# Patient Record
Sex: Female | Born: 1960 | Race: Black or African American | Hispanic: No | Marital: Married | Smoking: Never smoker
Health system: Southern US, Community
[De-identification: ages and names within clinical notes are randomized; demographics above are authoritative.]

## PROBLEM LIST (undated history)

## (undated) DIAGNOSIS — Q141 Congenital malformation of retina: Secondary | ICD-10-CM

## (undated) DIAGNOSIS — D219 Benign neoplasm of connective and other soft tissue, unspecified: Secondary | ICD-10-CM

## (undated) DIAGNOSIS — H409 Unspecified glaucoma: Secondary | ICD-10-CM

## (undated) DIAGNOSIS — E669 Obesity, unspecified: Secondary | ICD-10-CM

## (undated) DIAGNOSIS — E559 Vitamin D deficiency, unspecified: Secondary | ICD-10-CM

## (undated) HISTORY — DX: Unspecified glaucoma: H40.9

## (undated) HISTORY — DX: Obesity, unspecified: E66.9

## (undated) HISTORY — DX: Vitamin D deficiency, unspecified: E55.9

---
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
## 2002-12-15 ENCOUNTER — Encounter: Payer: Self-pay | Admitting: Internal Medicine

## 2002-12-15 ENCOUNTER — Encounter: Admission: RE | Admit: 2002-12-15 | Discharge: 2002-12-15 | Payer: Self-pay | Admitting: Internal Medicine

## 2002-12-15 ENCOUNTER — Encounter (INDEPENDENT_AMBULATORY_CARE_PROVIDER_SITE_OTHER): Payer: Self-pay | Admitting: Specialist

## 2003-12-27 ENCOUNTER — Encounter: Admission: RE | Admit: 2003-12-27 | Discharge: 2003-12-27 | Payer: Self-pay | Admitting: Internal Medicine

## 2009-12-06 ENCOUNTER — Emergency Department (HOSPITAL_COMMUNITY): Admission: EM | Admit: 2009-12-06 | Discharge: 2009-12-06 | Payer: Self-pay | Admitting: Family Medicine

## 2010-09-16 LAB — URINE CULTURE
Colony Count: NO GROWTH
Culture: NO GROWTH

## 2010-09-16 LAB — POCT URINALYSIS DIP (DEVICE)
Bilirubin Urine: NEGATIVE
Glucose, UA: NEGATIVE mg/dL
Ketones, ur: NEGATIVE mg/dL
Nitrite: NEGATIVE
Protein, ur: NEGATIVE mg/dL
Specific Gravity, Urine: 1.025 (ref 1.005–1.030)
Urobilinogen, UA: 0.2 mg/dL (ref 0.0–1.0)
pH: 5 (ref 5.0–8.0)

## 2011-07-31 ENCOUNTER — Encounter (HOSPITAL_COMMUNITY): Payer: Self-pay | Admitting: *Deleted

## 2011-07-31 ENCOUNTER — Emergency Department (HOSPITAL_COMMUNITY)
Admission: EM | Admit: 2011-07-31 | Discharge: 2011-07-31 | Disposition: A | Discharge status: Home / Self Care | Payer: 59 | Source: Home / Self Care | Attending: Emergency Medicine | Admitting: Emergency Medicine

## 2011-07-31 DIAGNOSIS — H6980 Other specified disorders of Eustachian tube, unspecified ear: Secondary | ICD-10-CM

## 2011-07-31 HISTORY — DX: Benign neoplasm of connective and other soft tissue, unspecified: D21.9

## 2011-07-31 MED ORDER — FEXOFENADINE-PSEUDOEPHED ER 60-120 MG PO TB12: 1.0000 | ORAL_TABLET | Freq: Two times a day (BID) | ORAL | Status: AC

## 2011-07-31 MED ORDER — ANTIPYRINE-BENZOCAINE 5.4-1.4 % OT SOLN: 3.0000 [drp] | Freq: Four times a day (QID) | OTIC | Status: AC | PRN

## 2011-07-31 MED ORDER — FLUTICASONE PROPIONATE 50 MCG/ACT NA SUSP: 2.0000 | Freq: Every day | NASAL | Status: DC

## 2011-07-31 NOTE — ED Provider Notes (Signed)
History     CSN: [id number]  Arrival date & time 07/31/11  1134   First MD Initiated Contact with Patient 07/31/11 1359      Chief Complaint  Patient presents with  . Otalgia    (Consider location/radiation/quality/duration/timing/severity/associated sxs/prior treatment) HPI Comments: Patient with left ear fullness, sensation of fluid behind her ear x3 weeks. Reports mild rhinorrhea, but no nasal discharge, frontal sinus pain/pressure. Reports occasional disequilibrium, but no vertigo, nausea, vomiting. No headaches. No change in hearing, ear pain, otorrhea. States her ear canal occasionally itches, and she had a physician look at it. Was noted to be swollen, but had no signs of infection. Patient denies any foreign body insertion into the ear. Patient states that her ear has been "popping" starting yesterday, and her symptoms are slightly better. Patient does have history of seasonal allergies, and takes Benadryl at night for the eye itching.  ROS as noted in HPI. All other ROS negative.   Patient is a 51 y.o. female presenting with plugged ear sensation.  Ear Fullness This is a new problem. The current episode started more than 1 week ago. The problem occurs constantly. The problem has not changed since onset.The symptoms are aggravated by nothing. The symptoms are relieved by nothing. She has tried nothing for the symptoms. The treatment provided no relief.    Past Medical History  Diagnosis Date  . Fibroids     Past Surgical History  Procedure Date  . Cesarean section     History reviewed. No pertinent family history.  History  Substance Use Topics  . Smoking status: Former Games developer  . Smokeless tobacco: Not on file  . Alcohol Use: No    OB History    Grav Para Term Preterm Abortions TAB SAB Ect Mult Living                  Review of Systems  Allergies  Review of patient's allergies indicates no known allergies.  Home Medications   Current Outpatient Rx    Name Route Sig Dispense Refill  . ANTIPYRINE-BENZOCAINE 5.4-1.4 % OT SOLN Left Ear Place 3 drops into the left ear 4 (four) times daily as needed for pain. 10 mL 0  . FEXOFENADINE-PSEUDOEPHED ER 60-120 MG PO TB12 Oral Take 1 tablet by mouth every 12 (twelve) hours. 14 tablet 0  . FLUTICASONE PROPIONATE 50 MCG/ACT NA SUSP Nasal Place 2 sprays into the nose daily. 16 g 0    BP 137/75  Pulse 82  Temp(Src) 98.4 F (36.9 C) (Oral)  Resp 16  SpO2 100%  LMP 07/31/2011  Physical Exam  Nursing note and vitals reviewed. Constitutional: She is oriented to person, place, and time. She appears well-developed and well-nourished.  HENT:  Head: Normocephalic and atraumatic.  Right Ear: Hearing, tympanic membrane, external ear and ear canal normal.  Left Ear: Hearing, external ear and ear canal normal. No drainage or swelling. Tympanic membrane is retracted. Tympanic membrane is not injected and not perforated.  No middle ear effusion.  Nose: Mucosal edema and rhinorrhea present. No epistaxis.  Mouth/Throat: Uvula is midline, oropharynx is clear and moist and mucous membranes are normal. No oropharyngeal exudate.       (-) frontal, maxillary sinus tenderness  Eyes: Conjunctivae and EOM are normal. Pupils are equal, round, and reactive to light.  Neck: Normal range of motion. Neck supple.  Cardiovascular: Normal rate, regular rhythm and normal heart sounds.   Pulmonary/Chest: Effort normal and breath sounds normal. No respiratory distress.  She has no wheezes. She has no rales.  Abdominal: She exhibits no distension. There is no tenderness. There is no rebound and no guarding.  Musculoskeletal: Normal range of motion.  Lymphadenopathy:    She has no cervical adenopathy.  Neurological: She is alert and oriented to person, place, and time. She has normal strength. No cranial nerve deficit or sensory deficit.       Gait nml  Skin: Skin is warm and dry. No rash noted.  Psychiatric: She has a normal  mood and affect. Her behavior is normal. Judgment and thought content normal.    ED Course  Procedures (including critical care time)  Labs Reviewed - No data to display No results found.   1. Eustachian tube dysfunction       MDM  This is eustachian tube dysfunction, most likely from rhinorrhea/seasonal allergies. No evidence of otitis externa, otitis media. No evidence of serous otitis. Patient does complain of ear canal itching, so we'll send home with Auralgan. Will also start her on antihistamine/decongestant, and nasal steroids.  Luiz Blare, MD 07/31/11 [phone]

## 2011-07-31 NOTE — ED Notes (Signed)
Pt  Has  Symptoms  Of  Pressure  And  Fullness  In l  Ear  With  Some   dizzyness  As  Well      For  Several  Weeks  She  Describes  sesation  Of  Popping  As  Well

## 2013-03-19 ENCOUNTER — Emergency Department (HOSPITAL_COMMUNITY)
Admission: EM | Admit: 2013-03-19 | Discharge: 2013-03-19 | Disposition: A | Discharge status: Home / Self Care | Payer: 59 | Attending: Emergency Medicine | Admitting: Emergency Medicine

## 2013-03-19 ENCOUNTER — Encounter (HOSPITAL_COMMUNITY): Payer: Self-pay | Admitting: Emergency Medicine

## 2013-03-19 DIAGNOSIS — M549 Dorsalgia, unspecified: Secondary | ICD-10-CM | POA: Insufficient documentation

## 2013-03-19 DIAGNOSIS — Z87891 Personal history of nicotine dependence: Secondary | ICD-10-CM | POA: Insufficient documentation

## 2013-03-19 DIAGNOSIS — Z8742 Personal history of other diseases of the female genital tract: Secondary | ICD-10-CM | POA: Insufficient documentation

## 2013-03-19 DIAGNOSIS — B029 Zoster without complications: Secondary | ICD-10-CM | POA: Insufficient documentation

## 2013-03-19 DIAGNOSIS — IMO0001 Reserved for inherently not codable concepts without codable children: Secondary | ICD-10-CM | POA: Insufficient documentation

## 2013-03-19 DIAGNOSIS — M542 Cervicalgia: Secondary | ICD-10-CM | POA: Insufficient documentation

## 2013-03-19 DIAGNOSIS — R21 Rash and other nonspecific skin eruption: Secondary | ICD-10-CM | POA: Insufficient documentation

## 2013-03-19 MED ORDER — NAPROXEN 500 MG PO TABS: 500.0000 mg | ORAL_TABLET | Freq: Two times a day (BID) | ORAL | Status: DC

## 2013-03-19 MED ORDER — HYDROCODONE-ACETAMINOPHEN 5-325 MG PO TABS
1.0000 | ORAL_TABLET | Freq: Once | ORAL | Status: AC
  Administered 2013-03-19: 1 via ORAL
  Filled 2013-03-19: qty 1

## 2013-03-19 MED ORDER — HYDROCODONE-ACETAMINOPHEN 5-325 MG PO TABS: 1.0000 | ORAL_TABLET | ORAL | Status: DC | PRN

## 2013-03-19 MED ORDER — VALACYCLOVIR HCL 1 G PO TABS: 1000.0000 mg | ORAL_TABLET | Freq: Three times a day (TID) | ORAL | Status: DC

## 2013-03-19 NOTE — ED Provider Notes (Addendum)
CSN: [id number]     Arrival date & time 03/19/13  7829 History   First MD Initiated Contact with Patient 03/19/13 0701     Chief Complaint  Patient presents with  . Arm Pain    HPI  Pt c/o pain from shoulder to hand on LUE.  Started tue or wed (2-4 days ago). Started with pain, described as burning.  No CP, SOB.  No fever or prostration. Uncertain if she ever had varicella, but states that she was exposed several years ago.  Had serum titers drawn, Results?  No weakness or clumbsiness with use of UE.  No LE symptoms.  No bowel/bladder changes.  Past Medical History  Diagnosis Date  . Fibroids    Past Surgical History  Procedure Laterality Date  . Cesarean section     No family history on file. History  Substance Use Topics  . Smoking status: Former Games developer  . Smokeless tobacco: Not on file  . Alcohol Use: No   OB History   Grav Para Term Preterm Abortions TAB SAB Ect Mult Living                 Review of Systems  Constitutional: Negative for fever, chills, diaphoresis, appetite change and fatigue.  HENT: Positive for neck pain. Negative for sore throat, mouth sores and trouble swallowing.   Eyes: Negative for visual disturbance.  Respiratory: Negative for cough, chest tightness, shortness of breath and wheezing.   Cardiovascular: Negative for chest pain.  Gastrointestinal: Negative for nausea, vomiting, abdominal pain, diarrhea and abdominal distention.  Endocrine: Negative for polydipsia, polyphagia and polyuria.  Genitourinary: Negative for dysuria, frequency and hematuria.  Musculoskeletal: Positive for myalgias, back pain and arthralgias. Negative for gait problem.  Skin: Positive for rash. Negative for color change and pallor.  Neurological: Negative for dizziness, syncope, light-headedness and headaches.  Hematological: Does not bruise/bleed easily.  Psychiatric/Behavioral: Negative for behavioral problems and confusion.    Allergies  Codeine  Home Medications     Current Outpatient Rx  Name  Route  Sig  Dispense  Refill  . PRESCRIPTION MEDICATION   Both Eyes   Place 1 drop into both eyes daily. For Glaucoma         . HYDROcodone-acetaminophen (NORCO/VICODIN) 5-325 MG per tablet   Oral   Take 1 tablet by mouth every 4 (four) hours as needed for pain.   10 tablet   0   . naproxen (NAPROSYN) 500 MG tablet   Oral   Take 1 tablet (500 mg total) by mouth 2 (two) times daily.   30 tablet   0   . valACYclovir (VALTREX) 1000 MG tablet   Oral   Take 1 tablet (1,000 mg total) by mouth 3 (three) times daily.   21 tablet   0    BP 154/99  Temp(Src) 98.2 F (36.8 C) (Oral)  Resp 12  SpO2 98% Physical Exam  Constitutional: She is oriented to person, place, and time. She appears well-developed and well-nourished. No distress.  HENT:  Head: Normocephalic.  Eyes: Conjunctivae are normal. Pupils are equal, round, and reactive to light. No scleral icterus.  Neck: Normal range of motion. Neck supple. No thyromegaly present.  Cardiovascular: Normal rate and regular rhythm.  Exam reveals no gallop and no friction rub.   No murmur heard. Pulmonary/Chest: Effort normal and breath sounds normal. No respiratory distress. She has no wheezes. She has no rales.  Abdominal: Soft. Bowel sounds are normal. She exhibits no distension. There  is no tenderness. There is no rebound.  Musculoskeletal: Normal range of motion.  Neurological: She is alert and oriented to person, place, and time.  Normal symmetric Strength to shoulder shrug, triceps, biceps, grip,wrist flex/extend,and intrinsics  Norma lsymmetric sensation above and below clavicles, and to all distributions to UEs. Norma symmetric strength to flex/.extend hip and knees, dorsi/plantar flex ankles. Normal symmetric sensation to all distributions to LEs Patellar and achilles reflexes 1-2+. Downgoing Babinski  Pain does follow C7 path.  Equivocal l Spurling's sign.  No Lhermitte's phenomenon.    Skin: Skin is warm and dry. No rash noted.     Psychiatric: She has a normal mood and affect. Her behavior is normal.    ED Course  Procedures (including critical care time)  EKG: Indication extremity pain back pain. Interpretation sinus rhythm no acute or ischemic changes. No ST changes. Normal intervals.  Labs Review Labs Reviewed - No data to display Imaging Review No results found.  MDM   1. Shingles    Pt with c7 radicular pain.  NOw with papules/bullae overlying C7 dermatome at elbow and palm consistent with shingles.  Plan:  Valtrex, motrin, vicoden.  F/u Dr. Ricki Miller.  We discussed radiculopathy as well.  If rash resolves and pain does not, then consideration for MRI with dx of Post Herpetic Neuralgia vs C7 radiculopathy.  No signs or symptoms of Myelopathy per history or exam now.    Roney Marion, MD 03/19/13 1610  Roney Marion, MD 03/19/13 [phone]

## 2013-03-19 NOTE — ED Notes (Signed)
Pt reports having  Left arm pain since Tuesday. Pt thought it was tendonitis, pt reports she has been taking ibuprofen since Tuesday, and has had no relief. Pt states that now the pain is "moving." Pt states she has pain in her neck, and sometimes that it radiates to her back from her arm. Denies loss of sensation in extremities. Denies n/v, sob.

## 2013-06-07 ENCOUNTER — Other Ambulatory Visit (HOSPITAL_COMMUNITY): Payer: Self-pay | Admitting: Obstetrics and Gynecology

## 2013-06-07 DIAGNOSIS — Z1231 Encounter for screening mammogram for malignant neoplasm of breast: Secondary | ICD-10-CM

## 2013-06-08 ENCOUNTER — Ambulatory Visit (AMBULATORY_SURGERY_CENTER): Payer: Self-pay | Admitting: *Deleted

## 2013-06-08 VITALS — Ht 59.0 in | Wt 215.0 lb

## 2013-06-08 DIAGNOSIS — Z1211 Encounter for screening for malignant neoplasm of colon: Secondary | ICD-10-CM

## 2013-06-08 MED ORDER — MOVIPREP 100 G PO SOLR: ORAL | Status: DC

## 2013-06-08 NOTE — Progress Notes (Signed)
No allergies to eggs or soy. No problems with anesthesia.  

## 2013-06-15 ENCOUNTER — Encounter: Payer: Self-pay | Admitting: Gastroenterology

## 2013-06-15 ENCOUNTER — Ambulatory Visit (AMBULATORY_SURGERY_CENTER): Payer: 59 | Admitting: Gastroenterology

## 2013-06-15 VITALS — BP 151/81 | HR 79 | Temp 97.4°F | Resp 10 | Ht 59.0 in | Wt 215.0 lb

## 2013-06-15 DIAGNOSIS — D126 Benign neoplasm of colon, unspecified: Secondary | ICD-10-CM

## 2013-06-15 DIAGNOSIS — D128 Benign neoplasm of rectum: Secondary | ICD-10-CM

## 2013-06-15 DIAGNOSIS — Z1211 Encounter for screening for malignant neoplasm of colon: Secondary | ICD-10-CM

## 2013-06-15 MED ORDER — SODIUM CHLORIDE 0.9 % IV SOLN: 500.0000 mL | INTRAVENOUS | Status: DC

## 2013-06-15 NOTE — Op Note (Signed)
[facility] [address] [place], [address]   COLONOSCOPY PROCEDURE REPORT  PATIENT: Tammy Tucker, Tammy Tucker  MR#: [id number] BIRTHDATE: March 11, 1961 , 52  yrs. old GENDER: Female ENDOSCOPIST: Rachael Fee, MD REFERRED [id number] Cherly Hensen, M.D. PROCEDURE DATE:  06/15/2013 PROCEDURE:   Colonoscopy with biopsy and snare polypectomy First Screening Colonoscopy - Avg.  risk and is 50 yrs.  old or older Yes.  Prior Negative Screening - Now for repeat screening. N/A  History of Adenoma - Now for follow-up colonoscopy & has been > or = to 3 yrs.  N/A  Polyps Removed Today? Yes. ASA CLASS:   Class II INDICATIONS:average risk screening. MEDICATIONS: Fentanyl 75 mcg IV, Versed 7 mg IV, and These medications were titrated to patient response per physician's verbal order  DESCRIPTION OF PROCEDURE:   After the risks benefits and alternatives of the procedure were thoroughly explained, informed consent was obtained.  A digital rectal exam revealed no abnormalities of the rectum.   The LB [id number]  endoscope was introduced through the anus and advanced to the cecum, which was identified by both the appendix and ileocecal valve. No adverse events experienced.   The quality of the prep was good.  The instrument was then slowly withdrawn as the colon was fully examined.  COLON FINDINGS: Three polyps were found, removed and two of them were retrieved to be sent to pathology.  One was sessile, located in transverse segment 2mm across, removed with biopsy forceps, retrieved.  The other two were sessil, 3-34mm across, located in transverse and rectum segments, both removed with cold snare, one was retrieved and sent to pathology.  The examination was otherwise normal.  Retroflexed views revealed no abnormalities. The time to cecum=2 minutes 32 seconds.  Withdrawal time=9 minutes 11 seconds. The scope was withdrawn and the procedure completed. COMPLICATIONS: There were no  complications.  ENDOSCOPIC IMPRESSION: Three polyps were found, removed and two of them were retrieved to be sent to pathology. The examination was otherwise normal.  RECOMMENDATIONS: If the polyp(s) removed today are proven to be adenomatous (pre-cancerous) polyps, you will need a repeat colonoscopy in 5 years.  Otherwise you should continue to follow colorectal cancer screening guidelines for "routine risk" patients with colonoscopy in 10 years.  You will receive a letter within 1-2 weeks with the results of your biopsy as well as final recommendations.  Please call my office if you have not received a letter after 3 weeks.   eSigned:  Rachael Fee, MD 06/15/2013 10:42 AM

## 2013-06-15 NOTE — Patient Instructions (Signed)
Impressions/recommendations:  Polyps (handout given)  Repeat colonoscopy pending pathology results.  YOU HAD AN ENDOSCOPIC PROCEDURE TODAY AT THE [facility]: Refer to the procedure report that was given to you for any specific questions about what was found during the examination.  If the procedure report does not answer your questions, please call your gastroenterologist to clarify.  If you requested that your care partner not be given the details of your procedure findings, then the procedure report has been included in a sealed envelope for you to review at your convenience later.  YOU SHOULD EXPECT: Some feelings of bloating in the abdomen. Passage of more gas than usual.  Walking can help get rid of the air that was put into your GI tract during the procedure and reduce the bloating. If you had a lower endoscopy (such as a colonoscopy or flexible sigmoidoscopy) you may notice spotting of blood in your stool or on the toilet paper. If you underwent a bowel prep for your procedure, then you may not have a normal bowel movement for a few days.  DIET: Your first meal following the procedure should be a light meal and then it is ok to progress to your normal diet.  A half-sandwich or bowl of soup is an example of a good first meal.  Heavy or fried foods are harder to digest and may make you feel nauseous or bloated.  Likewise meals heavy in dairy and vegetables can cause extra gas to form and this can also increase the bloating.  Drink plenty of fluids but you should avoid alcoholic beverages for 24 hours.  ACTIVITY: Your care partner should take you home directly after the procedure.  You should plan to take it easy, moving slowly for the rest of the day.  You can resume normal activity the day after the procedure however you should NOT DRIVE or use heavy machinery for 24 hours (because of the sedation medicines used during the test).    SYMPTOMS TO REPORT IMMEDIATELY: A  gastroenterologist can be reached at any hour.  During normal business hours, 8:30 AM to 5:00 PM Monday through Friday, call [phone].  After hours and on weekends, please call the GI answering service at [phone] who will take a message and have the physician on call contact you.   Following lower endoscopy (colonoscopy or flexible sigmoidoscopy):  Excessive amounts of blood in the stool  Significant tenderness or worsening of abdominal pains  Swelling of the abdomen that is new, acute  Fever of 100F or higher   FOLLOW UP: If any biopsies were taken you will be contacted by phone or by letter within the next 1-3 weeks.  Call your gastroenterologist if you have not heard about the biopsies in 3 weeks.  Our staff will call the home number listed on your records the next business day following your procedure to check on you and address any questions or concerns that you may have at that time regarding the information given to you following your procedure. This is a courtesy call and so if there is no answer at the home number and we have not heard from you through the emergency physician on call, we will assume that you have returned to your regular daily activities without incident.  SIGNATURES/CONFIDENTIALITY: You and/or your care partner have signed paperwork which will be entered into your electronic medical record.  These signatures attest to the fact that that the information above on your After Visit Summary has   been reviewed and is understood.  Full responsibility of the confidentiality of this discharge information lies with you and/or your care-partner. 

## 2013-06-15 NOTE — Progress Notes (Signed)
Pt had a 1st IV attempt in right wrist, states it is very painful. No swelling noted. Hot pack applied to arm which patient states feels good.

## 2013-06-16 ENCOUNTER — Telehealth: Payer: Self-pay | Admitting: *Deleted

## 2013-06-16 NOTE — Telephone Encounter (Signed)
  Follow up Call-  Left message to call if questions or concerns.

## 2013-06-22 ENCOUNTER — Encounter: Payer: Self-pay | Admitting: Gastroenterology

## 2013-07-06 ENCOUNTER — Ambulatory Visit (HOSPITAL_COMMUNITY)
Admission: RE | Admit: 2013-07-06 | Discharge: 2013-07-06 | Disposition: A | Discharge status: Home / Self Care | Payer: 59 | Source: Ambulatory Visit | Attending: Obstetrics and Gynecology | Admitting: Obstetrics and Gynecology

## 2013-07-06 DIAGNOSIS — Z1231 Encounter for screening mammogram for malignant neoplasm of breast: Secondary | ICD-10-CM | POA: Insufficient documentation

## 2013-08-06 ENCOUNTER — Encounter: Payer: 59 | Attending: Obstetrics and Gynecology

## 2013-09-03 ENCOUNTER — Encounter: Payer: 59 | Attending: Obstetrics and Gynecology

## 2013-09-03 VITALS — Ht 59.0 in | Wt 210.0 lb

## 2013-09-03 DIAGNOSIS — Z713 Dietary counseling and surveillance: Secondary | ICD-10-CM | POA: Insufficient documentation

## 2013-09-03 DIAGNOSIS — E119 Type 2 diabetes mellitus without complications: Secondary | ICD-10-CM | POA: Insufficient documentation

## 2013-09-27 ENCOUNTER — Ambulatory Visit: Payer: 59 | Admitting: Dietician

## 2013-10-03 NOTE — Progress Notes (Signed)
Patient was seen on 09/03/13 for the complete diabetes self-management series at the [facility].  Current A1c =  Patient states: 4 on 06/18/13  Handouts given during class include:  Living Well with Diabetes book  Carb Counting and Meal Planning book  Meal Plan Card  Carbohydrate guide  Meal planning worksheet  Low Sodium Flavoring Tips  The diabetes portion plate  Low Carbohydrate Snack Suggestions  A1c to eAG Conversion Chart  Diabetes Medications  Stress Management  Diabetes Recommended Care Schedule  Diabetes Success Plan  Core Class Satisfaction Survey  The following learning objectives were met by the patient during this course:  Describe diabetes  State some common risk factors for diabetes  Defines the role of glucose and insulin  Identifies type of diabetes and pathophysiology  Describe the relationship between diabetes and cardiovascular risk  State the members of the Healthcare Team  States the rationale for glucose monitoring  State when to test glucose  State their individual Target Range  State the importance of logging glucose readings  Describe how to interpret glucose readings  Identifies A1C target  Explain the correlation between A1c and eAG values  State symptoms and treatment of high blood glucose  State symptoms and treatment of low blood glucose  Explain proper technique for glucose testing  Identifies proper sharps disposal  Describe the role of different macronutrients on glucose  Explain how carbohydrates affect blood glucose  State what foods contain the most carbohydrates  Demonstrate carbohydrate counting  Demonstrate how to read Nutrition Facts food label  Describe effects of various fats on heart health  Describe the importance of good nutrition for health and healthy eating strategies  Describe techniques for managing your shopping, cooking and meal planning  List  strategies to follow meal plan when dining out  Describe the effects of alcohol on glucose and how to use it safely   State the amount of activity recommended for healthy living   Describe activities suitable for individual needs   Identify ways to regularly incorporate activity into daily life   Identify barriers to activity and ways to over come these barriers  Identify diabetes medications being personally used and their primary action for lowering glucose and possible side effects   Describe role of stress on blood glucose and develop strategies to address psychosocial issues   Identify diabetes complications and ways to prevent them  Explain how to manage diabetes during illness   Evaluate success in meeting personal goal   Establish 2-3 goals that they will plan to diligently work on until they return for the  59-month follow-up visit  Goals:  Follow Diabetes Meal Plan as instructed  Eat 3 meals and 2 snacks, every 3-5 hrs  Limit carbohydrate intake to 45 grams carbohydrate/meal Limit carbohydrate intake to 15 grams carbohydrate/snack Add lean protein foods to meals/snacks  Monitor glucose levels as instructed by your doctor  Aim for 15-30 mins of physical activity daily as tolerated  Bring food record and glucose log to your next nutrition visit  Your patient has established the following 4 month goals in their individualized success plan: Goals: I will count my carb choices at most meals and snacks I will increase my activity level at least 3 days a week  Your patient has identified these potential barriers to change:  none  Your patient has identified their diabetes self-care support plan as  none  Plan: Attend Core 4 in 4 months

## 2014-08-26 ENCOUNTER — Emergency Department (HOSPITAL_COMMUNITY)
Admission: EM | Admit: 2014-08-26 | Discharge: 2014-08-26 | Disposition: A | Discharge status: Home / Self Care | Payer: 59 | Source: Home / Self Care | Attending: Emergency Medicine | Admitting: Emergency Medicine

## 2014-08-26 ENCOUNTER — Encounter (HOSPITAL_COMMUNITY): Payer: Self-pay | Admitting: Emergency Medicine

## 2014-08-26 DIAGNOSIS — N200 Calculus of kidney: Secondary | ICD-10-CM

## 2014-08-26 LAB — POCT URINALYSIS DIP (DEVICE)
Bilirubin Urine: NEGATIVE
Glucose, UA: NEGATIVE mg/dL
Ketones, ur: NEGATIVE mg/dL
Leukocytes, UA: NEGATIVE
Nitrite: NEGATIVE
Protein, ur: NEGATIVE mg/dL
Specific Gravity, Urine: >=1.03 (ref 1.005–1.030)
Urobilinogen, UA: >=8 mg/dL (ref 0.0–1.0)
pH: 7 (ref 5.0–8.0)

## 2014-08-26 MED ORDER — IBUPROFEN 800 MG PO TABS: 800.0000 mg | ORAL_TABLET | Freq: Three times a day (TID) | ORAL | Status: DC | PRN

## 2014-08-26 MED ORDER — TAMSULOSIN HCL 0.4 MG PO CAPS: 0.4000 mg | ORAL_CAPSULE | Freq: Every day | ORAL | Status: DC

## 2014-08-26 MED ORDER — HYDROCODONE-ACETAMINOPHEN 5-325 MG PO TABS: 1.0000 | ORAL_TABLET | Freq: Four times a day (QID) | ORAL | Status: DC | PRN

## 2014-08-26 NOTE — ED Notes (Signed)
Reports on Monday bending over to work on a pt and felt a sharp pain in the left flank that radiates to front of abdomen.   Denies any urinary symptoms.  No known injury.  Pt has increased water intake with no relief in symptoms.

## 2014-08-26 NOTE — Discharge Instructions (Signed)
You likely had a kidney stone. Take Flomax daily to help the stone pass. Strain your urine to collect the stone. Take ibuprofen 3 times a day. Use the Norco as needed for severe pain. If your pain is not better in the next 3-4 days, please go to the emergency room for additional evaluation.

## 2014-08-26 NOTE — ED Provider Notes (Signed)
CSN: [id number]     Arrival date & time 08/26/14  1504 History   First MD Initiated Contact with Patient 08/26/14 1618     Chief Complaint  Patient presents with  . Flank Pain   (Consider location/radiation/quality/duration/timing/severity/associated sxs/prior Treatment) HPI She is a 54 year old woman here for evaluation of left flank pain. She states this started on Monday and she bent over to assist the patient. She reports colicky type pain in the left flank. It has been getting worse, particularly the last day or so. The pain is located in the left flank and will radiate around to her abdomen. No fevers or chills. No nausea, vomiting, diarrhea. No constipation. No dysuria. She has not noticed any hematuria. She has been taking ibuprofen with moderate improvement.  Past Medical History  Diagnosis Date  . Fibroids   . Vitamin D deficiency   . Glaucoma   . Hyperlipidemia   . Obesity    Past Surgical History  Procedure Laterality Date  . Cesarean section  1994, 1990  . Breast biopsy  1977    benign   Family History  Problem Relation Age of Onset  . Colon cancer Neg Hx    History  Substance Use Topics  . Smoking status: Former Smoker    Quit date: 06/30/1978  . Smokeless tobacco: Never Used  . Alcohol Use: No   OB History    No data available     Review of Systems  Constitutional: Negative for fever.  Gastrointestinal: Negative for nausea, vomiting, abdominal pain, diarrhea and constipation.  Genitourinary: Positive for flank pain. Negative for dysuria and hematuria.    Allergies  Codeine  Home Medications   Prior to Admission medications   Medication Sig Start Date End Date Taking? Authorizing Provider  ergocalciferol (VITAMIN D2) 50000 UNITS capsule Take 50,000 Units by mouth once a week.    Historical Provider, MD  HYDROcodone-acetaminophen (NORCO) 5-325 MG per tablet Take 1 tablet by mouth every 6 (six) hours as needed for moderate pain. 08/26/14   Melony Overly, MD  ibuprofen (ADVIL,MOTRIN) 800 MG tablet Take 1 tablet (800 mg total) by mouth every 8 (eight) hours as needed for moderate pain. 08/26/14   Melony Overly, MD  PRESCRIPTION MEDICATION Place 1 drop into both eyes daily. For Glaucoma    Historical Provider, MD  tamsulosin (FLOMAX) 0.4 MG CAPS capsule Take 1 capsule (0.4 mg total) by mouth daily. 08/26/14   Melony Overly, MD   BP 140/82 mmHg  Pulse 87  Temp(Src) 98.2 F (36.8 C) (Oral)  Resp 16  SpO2 97%  LMP 06/01/2013 Physical Exam  Constitutional: She is oriented to person, place, and time. She appears well-developed and well-nourished. She appears distressed (looks uncomfortable, shifting frequently).  Cardiovascular: Normal rate.   Pulmonary/Chest: Effort normal.  Abdominal: Soft.  No CVA tenderness  Neurological: She is alert and oriented to person, place, and time.    ED Course  Procedures (including critical care time) Labs Review Labs Reviewed  POCT URINALYSIS DIP (DEVICE) - Abnormal; Notable for the following:    Hgb urine dipstick TRACE (*)    All other components within normal limits    Imaging Review No results found.   MDM   1. Kidney stone    History and UA concerning for kidney stone. We'll treat with Flomax, ibuprofen, Norco. Recommended follow-up in the ER if not improved in the next 3-4 days.    Melony Overly, MD 08/26/14 [id number]

## 2014-10-02 NOTE — ED Notes (Signed)
Call from patient , asking for her paper work to be faxed to [facility], so she can be evaluated by a specialist, as she is not feeling well

## 2015-08-20 MED FILL — LATANOPROST 0.005% EYE DRP: 0.005 | 30 days supply | Qty: 3 | Fill #0

## 2015-10-01 ENCOUNTER — Encounter (INDEPENDENT_AMBULATORY_CARE_PROVIDER_SITE_OTHER): Payer: Self-pay | Admitting: Ophthalmology

## 2015-10-22 ENCOUNTER — Encounter (INDEPENDENT_AMBULATORY_CARE_PROVIDER_SITE_OTHER): Payer: Managed Care, Other (non HMO) | Admitting: Ophthalmology

## 2015-10-22 DIAGNOSIS — H43813 Vitreous degeneration, bilateral: Secondary | ICD-10-CM | POA: Diagnosis not present

## 2015-10-22 DIAGNOSIS — H2513 Age-related nuclear cataract, bilateral: Secondary | ICD-10-CM | POA: Diagnosis not present

## 2015-10-22 DIAGNOSIS — H3552 Pigmentary retinal dystrophy: Secondary | ICD-10-CM

## 2015-10-23 MED FILL — LATANOPROST 0.005% EYE DRP: 0.005 | 30 days supply | Qty: 3 | Fill #0

## 2016-02-29 MED FILL — LATANOPROST 0.005% EYE DRP: 0.005 | 30 days supply | Qty: 3 | Fill #1

## 2016-03-11 ENCOUNTER — Other Ambulatory Visit: Payer: Self-pay | Admitting: Obstetrics and Gynecology

## 2016-03-11 DIAGNOSIS — Z1231 Encounter for screening mammogram for malignant neoplasm of breast: Secondary | ICD-10-CM

## 2016-03-31 ENCOUNTER — Ambulatory Visit: Payer: 59

## 2017-04-02 ENCOUNTER — Encounter: Payer: Self-pay | Admitting: Internal Medicine

## 2017-04-02 ENCOUNTER — Telehealth: Payer: Self-pay | Admitting: Dietician

## 2017-04-02 ENCOUNTER — Ambulatory Visit (INDEPENDENT_AMBULATORY_CARE_PROVIDER_SITE_OTHER): Payer: BLUE CROSS/BLUE SHIELD | Admitting: Internal Medicine

## 2017-04-02 VITALS — BP 134/66 | HR 87 | Temp 97.7°F | Ht 59.0 in | Wt 196.7 lb

## 2017-04-02 DIAGNOSIS — Z0000 Encounter for general adult medical examination without abnormal findings: Secondary | ICD-10-CM | POA: Diagnosis not present

## 2017-04-02 DIAGNOSIS — M722 Plantar fascial fibromatosis: Secondary | ICD-10-CM | POA: Diagnosis not present

## 2017-04-02 MED ORDER — IBUPROFEN 800 MG PO TABS: 800.0000 mg | ORAL_TABLET | Freq: Three times a day (TID) | ORAL | 0 refills | Status: DC | PRN

## 2017-04-02 NOTE — Telephone Encounter (Signed)
Left message encouraging her to take advantage of her insurers 100% coverage for medical nutrition therapy and offered assistance with referral or services as needed.

## 2017-04-02 NOTE — Progress Notes (Signed)
CC: Right heel pain  HPI:  Tammy Tucker is a 56 y.o. female with PMH as listed below who presents for evaluation of right heel pain and establishing care.  Plantar Fasciitis Right Foot: Patient with 3-4 days right heel and posterior plantar foot pain described as a dull ache. Pain is worst first thing in the morning when she wakes up and improves during the day with activity. She has no associated numbness or tingling of the foot. She denies any obvious injury. She says she recently wore flat shoes. She is on her feet a lot for work.   Healthcare Maintenance: Patient reports being up to date with her pap smear and mammogram. She follows with her gynecologist, Dr. Servando Salina, for both of these. She had a colonoscopy 06/22/2013 and was recommended to have a repeat in 10 years (2024). She has not had the flu shot and will plan to receive it via work. She has deferred routine lab work for her next visit.  Social Hx: Has 3 children. Lives in [place], moved back here from [place] in March 2018. 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 Former smoker, no alcohol or illicit drug use.  Surgical Hx: Caesarian section x 2. Breast cyst removal as a teenager.  Family Hx: Mother with Diabetes. Sister with Diabetes.  Past Medical History:  Diagnosis Date  . Fibroids   . Glaucoma   . Obesity   . Vitamin D deficiency    Review of Systems:   Review of Systems  Constitutional: Negative for chills, fever and weight loss.  Respiratory: Negative for shortness of breath.   Cardiovascular: Negative for chest pain.  Gastrointestinal: Negative for abdominal pain, constipation, diarrhea, heartburn, nausea and vomiting.  Musculoskeletal: Negative for falls.       Right heel pain  Neurological: Negative for dizziness and loss of consciousness.     Physical Exam:  Vitals:   04/02/17 0836  BP: 134/66  Pulse: 87  Temp: 97.7 F (36.5 C)  TempSrc: Oral  SpO2: 98%  Weight: 196 lb 11.2 oz  (89.2 kg)  Height: 4\' 11"  (1.499 m)   Physical Exam  Constitutional: She is oriented to person, place, and time. She appears well-developed and well-nourished. No distress.  HENT:  Head: Normocephalic and atraumatic.  Cardiovascular: Normal rate and regular rhythm.   No murmur heard. Pulses:      Dorsalis pedis pulses are 2+ on the right side, and 2+ on the left side.       Posterior tibial pulses are 2+ on the right side, and 2+ on the left side.  Pulmonary/Chest: Effort normal. No respiratory distress. She has no wheezes. She has no rales.  Musculoskeletal: She exhibits no edema.  Mild tenderness to palpation of right posteromedial plantar surface. ROM intact. No swelling, erythema, or obvious injury.  Neurological: She is alert and oriented to person, place, and time.  Skin: Skin is warm. She is not diaphoretic.  Psychiatric: She has a normal mood and affect.    Assessment & Plan:   See Encounters Tab for problem based charting.  Patient discussed with Dr. Dareen Piano  Plantar fasciitis, right Symptoms most consistent with plantar fasciitis. Advised to try stretching exercises and can use Ibuprofen as needed for pain and inflammation. She will try shoe inserts and will work on losing weight as well. - Stretching exercises - Ibuprofen 800 mg q8h as needed - follow up in 4 weeks if no improvement  Healthcare maintenance Patient prefers to follow  up with her Gynecologist for pap smear and mammograms. She declined flu shot today, will obtain through work. She declined routine blood work today but is interested in obtaining an A1c in the future. She denies any prior history of diabetes. She is interested in weight loss and advised on healthy eating patterns and given a pamphlet for Health and Wellness.

## 2017-04-02 NOTE — Progress Notes (Signed)
Internal Medicine Clinic Attending  Case discussed with Dr. Patel at the time of the visit.  We reviewed the resident's history and exam and pertinent patient test results.  I agree with the assessment, diagnosis, and plan of care documented in the resident's note.  

## 2017-04-02 NOTE — Assessment & Plan Note (Signed)
Patient prefers to follow up with her Gynecologist for pap smear and mammograms. She declined flu shot today, will obtain through work. She declined routine blood work today but is interested in obtaining an A1c in the future. She denies any prior history of diabetes. She is interested in weight loss and advised on healthy eating patterns and given a pamphlet for Health and Wellness.

## 2017-04-02 NOTE — Assessment & Plan Note (Signed)
Symptoms most consistent with plantar fasciitis. Advised to try stretching exercises and can use Ibuprofen as needed for pain and inflammation. She will try shoe inserts and will work on losing weight as well. - Stretching exercises - Ibuprofen 800 mg q8h as needed - follow up in 4 weeks if no improvement

## 2017-04-02 NOTE — Patient Instructions (Addendum)
It was a pleasure to meet you Ms. Cousins.  It sounds like you have plantar fasciitis.  This may take a few weeks to get better, but you can help by doing the stretching exercises.  You can use Ibuprofen 800 mg every 8 hours as needed for pain and inflammation. I have sent a prescription to the [facility] on [address].  Please follow up with [place] in about 4 weeks if symptoms do not improve.   Plantar Fasciitis Plantar fasciitis is a painful foot condition that affects the heel. It occurs when the band of tissue that connects the toes to the heel bone (plantar fascia) becomes irritated. This can happen after exercising too much or doing other repetitive activities (overuse injury). The pain from plantar fasciitis can range from mild irritation to severe pain that makes it difficult for you to walk or move. The pain is usually worse in the morning or after you have been sitting or lying down for a while. What are the causes? This condition may be caused by:  Standing for long periods of time.  Wearing shoes that do not fit.  Doing high-impact activities, including running, aerobics, and ballet.  Being overweight.  Having an abnormal way of walking (gait).  Having tight calf muscles.  Having high arches in your feet.  Starting a new athletic activity.  What are the signs or symptoms? The main symptom of this condition is heel pain. Other symptoms include:  Pain that gets worse after activity or exercise.  Pain that is worse in the morning or after resting.  Pain that goes away after you walk for a few minutes.  How is this diagnosed? This condition may be diagnosed based on your signs and symptoms. Your health care provider will also do a physical exam to check for:  A tender area on the bottom of your foot.  A high arch in your foot.  Pain when you move your foot.  Difficulty moving your foot.  You may also need to have imaging studies to confirm the diagnosis. These can  include:  X-rays.  Ultrasound.  MRI.  How is this treated? Treatment for plantar fasciitis depends on the severity of the condition. Your treatment may include:  Rest, ice, and over-the-counter pain medicines to manage your pain.  Exercises to stretch your calves and your plantar fascia.  A splint that holds your foot in a stretched, upward position while you sleep (night splint).  Physical therapy to relieve symptoms and prevent problems in the future.  Cortisone injections to relieve severe pain.  Extracorporeal shock wave therapy (ESWT) to stimulate damaged plantar fascia with electrical impulses. It is often used as a last resort before surgery.  Surgery, if other treatments have not worked after 12 months.  Follow these instructions at home:  Take medicines only as directed by your health care provider.  Avoid activities that cause pain.  Roll the bottom of your foot over a bag of ice or a bottle of cold water. Do this for 20 minutes, 3-4 times a day.  Perform simple stretches as directed by your health care provider.  Try wearing athletic shoes with air-sole or gel-sole cushions or soft shoe inserts.  Wear a night splint while sleeping, if directed by your health care provider.  Keep all follow-up appointments with your health care provider. How is this prevented?  Do not perform exercises or activities that cause heel pain.  Consider finding low-impact activities if you continue to have problems.  Lose weight if you need to. The best way to prevent plantar fasciitis is to avoid the activities that aggravate your plantar fascia. Contact a health care provider if:  Your symptoms do not go away after treatment with home care measures.  Your pain gets worse.  Your pain affects your ability to move or do your daily activities. This information is not intended to replace advice given to you by your health care provider. Make sure you discuss any questions you  have with your health care provider. Document Released: 03/11/2001 Document Revised: 11/19/2015 Document Reviewed: 04/26/2014 Elsevier Interactive Patient Education  [employer].

## 2018-12-17 DIAGNOSIS — H3552 Pigmentary retinal dystrophy: Secondary | ICD-10-CM | POA: Diagnosis not present

## 2018-12-17 DIAGNOSIS — H17822 Peripheral opacity of cornea, left eye: Secondary | ICD-10-CM | POA: Diagnosis not present

## 2018-12-17 DIAGNOSIS — H40053 Ocular hypertension, bilateral: Secondary | ICD-10-CM | POA: Diagnosis not present

## 2019-07-11 ENCOUNTER — Ambulatory Visit: Payer: BLUE CROSS/BLUE SHIELD | Admitting: Family Medicine

## 2019-09-14 ENCOUNTER — Ambulatory Visit: Payer: BC Managed Care – PPO | Admitting: Family Medicine

## 2019-10-24 ENCOUNTER — Ambulatory Visit (INDEPENDENT_AMBULATORY_CARE_PROVIDER_SITE_OTHER): Payer: BC Managed Care – PPO | Admitting: Family Medicine

## 2019-10-24 ENCOUNTER — Encounter: Payer: Self-pay | Admitting: Family Medicine

## 2019-10-24 ENCOUNTER — Other Ambulatory Visit: Payer: Self-pay

## 2019-10-24 VITALS — BP 124/82 | HR 62 | Temp 97.8°F | Resp 16 | Ht 59.0 in | Wt 196.4 lb

## 2019-10-24 DIAGNOSIS — R200 Anesthesia of skin: Secondary | ICD-10-CM | POA: Diagnosis not present

## 2019-10-24 DIAGNOSIS — Z1159 Encounter for screening for other viral diseases: Secondary | ICD-10-CM | POA: Diagnosis not present

## 2019-10-24 DIAGNOSIS — E1169 Type 2 diabetes mellitus with other specified complication: Secondary | ICD-10-CM | POA: Diagnosis not present

## 2019-10-24 DIAGNOSIS — Z9189 Other specified personal risk factors, not elsewhere classified: Secondary | ICD-10-CM

## 2019-10-24 DIAGNOSIS — E559 Vitamin D deficiency, unspecified: Secondary | ICD-10-CM | POA: Diagnosis not present

## 2019-10-24 DIAGNOSIS — E785 Hyperlipidemia, unspecified: Secondary | ICD-10-CM

## 2019-10-24 DIAGNOSIS — Z114 Encounter for screening for human immunodeficiency virus [HIV]: Secondary | ICD-10-CM

## 2019-10-24 DIAGNOSIS — Z6839 Body mass index (BMI) 39.0-39.9, adult: Secondary | ICD-10-CM | POA: Insufficient documentation

## 2019-10-24 DIAGNOSIS — R7303 Prediabetes: Secondary | ICD-10-CM | POA: Diagnosis not present

## 2019-10-24 DIAGNOSIS — F419 Anxiety disorder, unspecified: Secondary | ICD-10-CM | POA: Diagnosis not present

## 2019-10-24 LAB — COMPREHENSIVE METABOLIC PANEL
ALT: 16 U/L (ref 0–35)
AST: 16 U/L (ref 0–37)
Albumin: 4.4 g/dL (ref 3.5–5.2)
Alkaline Phosphatase: 85 U/L (ref 39–117)
BUN: 10 mg/dL (ref 6–23)
CO2: 30 mEq/L (ref 19–32)
Calcium: 9.5 mg/dL (ref 8.4–10.5)
Chloride: 104 mEq/L (ref 96–112)
Creatinine, Ser: 0.63 mg/dL (ref 0.40–1.20)
GFR: 117.04 mL/min (ref >60.00)
Glucose, Bld: 102 mg/dL — ABNORMAL HIGH (ref 70–99)
Potassium: 3.5 mEq/L (ref 3.5–5.1)
Sodium: 141 mEq/L (ref 135–145)
Total Bilirubin: 0.4 mg/dL (ref 0.2–1.2)
Total Protein: 7.2 g/dL (ref 6.0–8.3)

## 2019-10-24 LAB — LIPID PANEL
Cholesterol: 263 mg/dL — ABNORMAL HIGH (ref 0–200)
HDL: 62.9 mg/dL (ref >39.00)
LDL Cholesterol: 180 mg/dL — ABNORMAL HIGH (ref 0–99)
NonHDL: 200.01
Total CHOL/HDL Ratio: 4
Triglycerides: 101 mg/dL (ref 0.0–149.0)
VLDL: 20.2 mg/dL (ref 0.0–40.0)

## 2019-10-24 LAB — VITAMIN B12: Vitamin B-12: 809 pg/mL (ref 211–911)

## 2019-10-24 LAB — TSH: TSH: 1.29 u[IU]/mL (ref 0.35–4.50)

## 2019-10-24 LAB — HEMOGLOBIN A1C: Hgb A1c MFr Bld: 5.7 % (ref 4.6–6.5)

## 2019-10-24 LAB — VITAMIN D 25 HYDROXY (VIT D DEFICIENCY, FRACTURES): VITD: 19.5 ng/mL — ABNORMAL LOW (ref 30.00–100.00)

## 2019-10-24 MED ORDER — ATORVASTATIN CALCIUM 20 MG PO TABS: 20.0000 mg | ORAL_TABLET | Freq: Every day | ORAL | 1 refills | Status: DC

## 2019-10-24 MED ORDER — HYDROXYZINE HCL 25 MG PO TABS: 25.0000 mg | ORAL_TABLET | Freq: Three times a day (TID) | ORAL | 1 refills | Status: DC | PRN

## 2019-10-24 MED ORDER — VITAMIN D (ERGOCALCIFEROL) 1.25 MG (50000 UNIT) PO CAPS: 50000.0000 [IU] | ORAL_CAPSULE | ORAL | 0 refills | Status: AC

## 2019-10-24 NOTE — Assessment & Plan Note (Signed)
She is not interested in daily SSRI.  She prefers something she can take as needed. Recommend hydroxyzine 25 mg up to 3 times per day as needed for anxiety.  We discussed some side effects of medication.

## 2019-10-24 NOTE — Progress Notes (Signed)
HPI:  Tammy Tucker is a 59 y.o. female, who is here today to establish care.  Former PCP: Dr Trilby Drummer Last preventive routine visit: 2016  Chronic medical problems: Hyperlipidemia, anxiety, vitamin D deficiency, fibroids,and obesity. Upon records review there is a history of DM 2, which she denies. According to patient, she had labs done during her last visit with her gynecologist, Dr.Cousin, and she was told she was borderline.  Last visit with her gynecologist was a few years ago.  Denies abdominal pain, nausea,vomiting, polydipsia,polyuria, or polyphagia.  HLD: She is trying to follow low-fat diet. She not consistently following a healthful diet. She loves cakes, bread, and chips. She is trying to decrease amount of carbs intake.  She exercises regularly, she walks daily for about 15 minutes.  Concerns today: She is requesting a prescription for anxiety. Problem is getting worse. Exacerbated by son's mental illness, diagnosed with bipolar 2015.  Some stress in the family, her son is not very compliant with her medications and her husband "yells all the time."  Negative for depressed mood.  She is also complaining about 6 months of intermittent RLE numbness, lateral aspect of the right.  It seems to be exacerbated by lying on left side for "long time" or prolonged standing/walking. Problem seems to be stable. She denies back pain, saddle anesthesia, or changes in bowel/bladder function. Negative for lower extremity weakness. She has not taking OTC medication.  Review of Systems  Constitutional: Negative for activity change, appetite change, fatigue and fever.  HENT: Negative for mouth sores, nosebleeds and sore throat.   Eyes: Negative for redness and visual disturbance.  Respiratory: Negative for cough, shortness of breath and wheezing.   Cardiovascular: Negative for chest pain, palpitations and leg swelling.  Gastrointestinal:       Negative for changes in bowel  habits.  Genitourinary: Negative for decreased urine volume, dysuria and hematuria.  Skin: Negative for rash and wound.  Neurological: Negative for syncope, weakness and headaches.  Psychiatric/Behavioral: Negative for confusion. The patient is nervous/anxious.   Rest see pertinent positives and negatives per HPI.  Current Outpatient Medications on File Prior to Visit  Medication Sig Dispense Refill  . latanoprost (XALATAN) 0.005 % ophthalmic solution 1 drop at bedtime.     No current facility-administered medications on file prior to visit.   Past Medical History:  Diagnosis Date  . Fibroids   . Glaucoma   . Obesity   . Vitamin D deficiency    Allergies  Allergen Reactions  . Codeine Itching    Blisters around mouth    Family History  Problem Relation Age of Onset  . Diabetes Mother   . Hearing loss Mother   . Hypertension Mother   . Diabetes Sister   . Asthma Sister   . Alcohol abuse Father   . Depression Father   . Early death Father   . Asthma Sister   . Depression Sister   . Asthma Sister   . Depression Sister   . Hypertension Sister   . Bipolar disorder Son   . Colon cancer Neg Hx     Social History   Socioeconomic History  . Marital status: Married    Spouse name: Not on file  . Number of children: 3  . Years of education: Not on file  . Highest education level: Not on file  Occupational History  . Not on file  Tobacco Use  . Smoking status: Former Smoker  Quit date: 06/30/1978    Years since quitting: 41.3  . Smokeless tobacco: Never Used  Substance and Sexual Activity  . Alcohol use: No  . Drug use: No  . Sexual activity: Yes    Partners: Male  Other Topics Concern  . Not on file  Social History Narrative  . Not on file   Social Determinants of Health   Financial Resource Strain:   . Difficulty of Paying Living Expenses:   Food Insecurity:   . Worried About Charity fundraiser in the Last Year:   . Arboriculturist in the Last Year:    Transportation Needs:   . Film/video editor (Medical):   Marland Kitchen Lack of Transportation (Non-Medical):   Physical Activity:   . Days of Exercise per Week:   . Minutes of Exercise per Session:   Stress:   . Feeling of Stress :   Social Connections:   . Frequency of Communication with Friends and Family:   . Frequency of Social Gatherings with Friends and Family:   . Attends Religious Services:   . Active Member of Clubs or Organizations:   . Attends Archivist Meetings:   Marland Kitchen Marital Status:     Vitals:   10/24/19 1140  BP: 124/82  Pulse: 62  Resp: 16  Temp: 97.8 F (36.6 C)  SpO2: 98%    Body mass index is 39.66 kg/m.  Physical Exam  Nursing note and vitals reviewed. Constitutional: She is oriented to person, place, and time. She appears well-developed. No distress.  HENT:  Head: Normocephalic and atraumatic.  Mouth/Throat: Oropharynx is clear and moist and mucous membranes are normal.  Eyes: Pupils are equal, round, and reactive to light. Conjunctivae are normal.  Cardiovascular: Normal rate and regular rhythm.  No murmur heard. Pulses:      Dorsalis pedis pulses are 2+ on the right side and 2+ on the left side.  R>L varicose veins.  Respiratory: Effort normal and breath sounds normal. No respiratory distress.  GI: Soft. She exhibits no mass. There is no hepatomegaly. There is no abdominal tenderness.  Musculoskeletal:        General: No edema.  Lymphadenopathy:    She has no cervical adenopathy.  Neurological: She is alert and oriented to person, place, and time. She has normal strength. No cranial nerve deficit. Gait normal.  Sensation of right thigh grossly intact.  Skin: Skin is warm. No rash noted. No erythema.  Psychiatric: Her mood appears anxious.  Well groomed, good eye contact.   ASSESSMENT AND PLAN:  [name] was seen today for establish care.  Diagnoses and all orders for this visit:  Orders Placed This Encounter  Procedures  .  Hemoglobin A1c  . Hepatitis C antibody  . Comprehensive metabolic panel  . HIV Antibody (routine testing w rflx)  . Lipid panel  . VITAMIN D 25 Hydroxy (Vit-D Deficiency, Fractures)  . TSH  . Vitamin B12   Lab Results  Component Value Date   TSH 1.29 10/24/2019   Lab Results  Component Value Date   [id number] 10/24/2019   Lab Results  Component Value Date   CREATININE 0.63 10/24/2019   BUN 10 10/24/2019   NA 141 10/24/2019   K 3.5 10/24/2019   CL 104 10/24/2019   CO2 30 10/24/2019   Lab Results  Component Value Date   HGBA1C 5.7 10/24/2019   Lab Results  Component Value Date   CHOL 263 (H) 10/24/2019   HDL  62.90 10/24/2019   LDLCALC 180 (H) 10/24/2019   TRIG 101.0 10/24/2019   CHOLHDL 4 10/24/2019   Lab Results  Component Value Date   ALT 16 10/24/2019   AST 16 10/24/2019   ALKPHOS 85 10/24/2019   BILITOT 0.4 10/24/2019    Body mass index (BMI) of 39.0-39.9 in adult We discussed benefits of wt loss as well as adverse effects of obesity. Consistency with healthy diet and physical activity recommended.   Hyperlipidemia Prior lipid panel is not available. For now continue nonpharmacologic treatment. Further recommendation will be given according to 10-year CVD score.   Vitamin D deficiency, unspecified Continue current vitamin D supplementation (?  400 units). Further recommendation will be given according to 25 [place] vitamin D result.  Anxiety disorder She is not interested in daily SSRI.  She prefers something she can take as needed. Recommend hydroxyzine 25 mg up to 3 times per day as needed for anxiety.  We discussed some side effects of medication.  Controlled type 2 diabetes mellitus with complication, without long-term current use of insulin ([place]) She denies history of diabetes. Eye exam about 2 months ago, negative for diabetic retinopathy. Further recommendation will be given according to lab results.  Encounter for HCV screening test for  high risk patient -     Hepatitis C antibody  Encounter for screening for HIV -     HIV Antibody (routine testing w rflx)  Lower extremity numbness Stable. We discussed possible etiologies, including peripheral neuropathy, radiculopathy, vitamin deficiency, or vein disease among some. She prefers to hold on trial of oral prednisone. Instructed about warning signs. Further recommendations will be given according to lab results.   Return in 4 months (on 02/23/2020) for wt,HLD.  Quetzally Callas G. Martinique, MD  [facility]. [place] office.   A few things to remember from today's visit:   Depending of lab results we will plan for next follow up. Please arrange appt with your gyn. You need a mammogram.  Continue working on wt loss.  Take Hydroxyzine for anxiety, it makes you drowsy.   Please be sure medication list is accurate. If a new problem present, please set up appointment sooner than planned today.

## 2019-10-24 NOTE — Patient Instructions (Signed)
A few things to remember from today's visit:   Depending of lab results we will plan for next follow up. Please arrange appt with your gyn. You need a mammogram.  Continue working on wt loss.  Take Hydroxyzine for anxiety, it makes you drowsy.   Please be sure medication list is accurate. If a new problem present, please set up appointment sooner than planned today.

## 2019-10-24 NOTE — Assessment & Plan Note (Signed)
Continue current vitamin D supplementation (?  400 units). Further recommendation will be given according to 25 OH vitamin D result.

## 2019-10-24 NOTE — Assessment & Plan Note (Signed)
Prior lipid panel is not available. For now continue nonpharmacologic treatment. Further recommendation will be given according to 10-year CVD score.

## 2019-10-24 NOTE — Assessment & Plan Note (Signed)
We discussed benefits of wt loss as well as adverse effects of obesity. Consistency with healthy diet and physical activity recommended.  

## 2019-10-25 LAB — HIV ANTIBODY (ROUTINE TESTING W REFLEX): HIV 1&2 Ab, 4th Generation: NONREACTIVE

## 2019-10-25 LAB — HEPATITIS C ANTIBODY
Hepatitis C Ab: NONREACTIVE
SIGNAL TO CUT-OFF: 0.01 (ref <1.00)

## 2019-10-27 ENCOUNTER — Telehealth: Payer: Self-pay | Admitting: Family Medicine

## 2019-10-27 NOTE — Telephone Encounter (Signed)
Pt would like a call from [place] to want to talk to her  about medication    [phone] no other information given

## 2019-10-28 NOTE — Telephone Encounter (Signed)
She can try low dose Sertraline, 25 mg daily. I do not recommend Xanax, this is a controlled med that can cause serious side effects. Thanks, BJ

## 2019-10-28 NOTE — Telephone Encounter (Signed)
Spoke with patient and she stated that the Hydroxyzine gave her a really bad headache and she doesn't want to take that anymore. Patient wants to know if she can get Xanax 0.5 mg sent to her pharmacy. Please advise

## 2019-10-31 ENCOUNTER — Other Ambulatory Visit: Payer: Self-pay | Admitting: *Deleted

## 2019-10-31 MED ORDER — SERTRALINE HCL 25 MG PO TABS
25.0000 mg | ORAL_TABLET | Freq: Every day | ORAL | 0 refills | Status: DC
Start: 2019-10-31 — End: 2019-12-20

## 2019-10-31 NOTE — Telephone Encounter (Signed)
Pt returned phone call, would like a call back.  

## 2019-10-31 NOTE — Telephone Encounter (Signed)
Left message to return call to office.

## 2019-10-31 NOTE — Telephone Encounter (Signed)
Spoke with patient, gave recommendations per Dr. Martinique. Patient verbalized understanding. Rx sent to pharmacy as requested.

## 2019-11-17 ENCOUNTER — Other Ambulatory Visit: Payer: Self-pay | Admitting: Family Medicine

## 2019-11-17 DIAGNOSIS — E559 Vitamin D deficiency, unspecified: Secondary | ICD-10-CM

## 2019-12-13 ENCOUNTER — Other Ambulatory Visit: Payer: Self-pay

## 2019-12-13 ENCOUNTER — Telehealth: Payer: Self-pay | Admitting: Family Medicine

## 2019-12-13 NOTE — Telephone Encounter (Signed)
disregard

## 2019-12-14 ENCOUNTER — Encounter: Payer: Self-pay | Admitting: Family Medicine

## 2019-12-14 ENCOUNTER — Ambulatory Visit (INDEPENDENT_AMBULATORY_CARE_PROVIDER_SITE_OTHER): Payer: BC Managed Care – PPO | Admitting: Family Medicine

## 2019-12-14 VITALS — BP 138/86 | HR 68 | Temp 97.9°F | Wt 195.0 lb

## 2019-12-14 DIAGNOSIS — S46911A Strain of unspecified muscle, fascia and tendon at shoulder and upper arm level, right arm, initial encounter: Secondary | ICD-10-CM | POA: Diagnosis not present

## 2019-12-14 DIAGNOSIS — M25551 Pain in right hip: Secondary | ICD-10-CM | POA: Diagnosis not present

## 2019-12-14 DIAGNOSIS — T148XXA Other injury of unspecified body region, initial encounter: Secondary | ICD-10-CM | POA: Diagnosis not present

## 2019-12-14 MED ORDER — MELOXICAM 7.5 MG PO TABS: 7.5000 mg | ORAL_TABLET | Freq: Every day | ORAL | 0 refills | Status: DC

## 2019-12-14 MED ORDER — CYCLOBENZAPRINE HCL 5 MG PO TABS: 5.0000 mg | ORAL_TABLET | Freq: Every evening | ORAL | 0 refills | Status: AC | PRN

## 2019-12-14 NOTE — Progress Notes (Signed)
Subjective:    Patient ID: Tammy Tucker, female    DOB: June 22, 1961, 59 y.o.   MRN: [id number]  No chief complaint on file.   HPI Patient was seen today for acute concern.  Patient endorses right hip/groin pain after pushing and moving to patient's last Thursday.  Patient states she woke up on Friday feeling sore and achy.  Patient notes feeling occurs with certain movements but better when sitting.  Patient has taken ibuprofen 800 mg and Tylenol for her symptoms.  Patient has not notified her job of her injury.  Past Medical History:  Diagnosis Date  . Fibroids   . Glaucoma   . Obesity   . Vitamin D deficiency     Allergies  Allergen Reactions  . Codeine Itching    Blisters around mouth    ROS General: Denies fever, chills, night sweats, changes in weight, changes in appetite HEENT: Denies headaches, ear pain, changes in vision, rhinorrhea, sore throat CV: Denies CP, palpitations, SOB, orthopnea Pulm: Denies SOB, cough, wheezing GI: Denies abdominal pain, nausea, vomiting, diarrhea, constipation GU: Denies dysuria, hematuria, frequency, vaginal discharge Msk: Denies muscle cramps, joint pains  + right shoulder and hip pain Neuro: Denies weakness, numbness, tingling Skin: Denies rashes, bruising Psych: Denies depression, anxiety, hallucinations     Objective:    Blood pressure 138/86, pulse 68, temperature 97.9 F (36.6 C), temperature source Temporal, weight 195 lb (88.5 kg), last menstrual period 06/01/2013, SpO2 98 %.   Gen. Pleasant, well-nourished, in no distress, normal affect   HEENT: [place]/AT, face symmetric, no scleral icterus, PERRLA, EOMI, nares patent without drainage Lungs: no accessory muscle use, CTAB, no wheezes or rales Cardiovascular: RRR, no m/r/g, no peripheral edema Musculoskeletal: No TTP of cervical, thoracic, or lumbar spine, paraspinal muscles, or sciatic nerve.  TTP of R hip.  Negative straight leg raise, logroll, FADIR and FABER. No deformities, no  cyanosis or clubbing, normal tone Neuro:  A&Ox3, CN II-XII intact, normal gait Skin:  Warm, no lesions/ rash   Wt Readings from Last 3 Encounters:  12/14/19 195 lb (88.5 kg)  10/24/19 196 lb 6 oz (89.1 kg)  04/02/17 196 lb 11.2 oz (89.2 kg)    Lab Results  Component Value Date   GLUCOSE 102 (H) 10/24/2019   CHOL 263 (H) 10/24/2019   TRIG 101.0 10/24/2019   HDL 62.90 10/24/2019   LDLCALC 180 (H) 10/24/2019   ALT 16 10/24/2019   AST 16 10/24/2019   NA 141 10/24/2019   K 3.5 10/24/2019   CL 104 10/24/2019   CREATININE 0.63 10/24/2019   BUN 10 10/24/2019   CO2 30 10/24/2019   TSH 1.29 10/24/2019   HGBA1C 5.7 10/24/2019    Assessment/Plan:  Muscle strain  -Discussed supportive care including rest, ice, heat, stretching, NSAIDs -Will try flexeril qhs prn.  Advised may make pt sleepy -pt advised to let her job know of injury. -given a note to be out of work x 2 days. - Plan: cyclobenzaprine (FLEXERIL) 5 MG tablet  Acute pain of right hip  - Plan: meloxicam (MOBIC) 7.5 MG tablet  Strain of right shoulder, initial encounter  - Plan: cyclobenzaprine (FLEXERIL) 5 MG tablet  F/u prn with pcp  Grier Mitts, MD

## 2019-12-14 NOTE — Patient Instructions (Addendum)
Hip Pain The hip is the joint between the upper legs and the lower pelvis. The bones, cartilage, tendons, and muscles of your hip joint support your body and allow you to move around. Hip pain can range from a minor ache to severe pain in one or both of your hips. The pain may be felt on the inside of the hip joint near the groin, or on the outside near the buttocks and upper thigh. You may also have swelling or stiffness in your hip area. Follow these instructions at home: Managing pain, stiffness, and swelling      If directed, put ice on the painful area. To do this: ? Put ice in a plastic bag. ? Place a towel between your skin and the bag. ? Leave the ice on for 20 minutes, 2-3 times a day.  If directed, apply heat to the affected area as often as told by your health care provider. Use the heat source that your health care provider recommends, such as a moist heat pack or a heating pad. ? Place a towel between your skin and the heat source. ? Leave the heat on for 20-30 minutes. ? Remove the heat if your skin turns bright red. This is especially important if you are unable to feel pain, heat, or cold. You may have a greater risk of getting burned. Activity  Do exercises as told by your health care provider.  Avoid activities that cause pain. General instructions   Take over-the-counter and prescription medicines only as told by your health care provider.  Keep a journal of your symptoms. Write down: ? How often you have hip pain. ? The location of your pain. ? What the pain feels like. ? What makes the pain worse.  Sleep with a pillow between your legs on your most comfortable side.  Keep all follow-up visits as told by your health care provider. This is important. Contact a health care provider if:  You cannot put weight on your leg.  Your pain or swelling continues or gets worse after one week.  It gets harder to walk.  You have a fever. Get help right away  if:  You fall.  You have a sudden increase in pain and swelling in your hip.  Your hip is red or swollen or very tender to touch. Summary  Hip pain can range from a minor ache to severe pain in one or both of your hips.  The pain may be felt on the inside of the hip joint near the groin, or on the outside near the buttocks and upper thigh.  Avoid activities that cause pain.  Write down how often you have hip pain, the location of the pain, what makes it worse, and what it feels like. This information is not intended to replace advice given to you by your health care provider. Make sure you discuss any questions you have with your health care provider. Document Revised: 11/01/2018 Document Reviewed: 11/01/2018 Elsevier Patient Education  [place].  Muscle Strain A muscle strain is an injury that happens when a muscle is stretched longer than normal. This can happen during a fall, sports, or lifting. This can tear some muscle fibers. Usually, recovery from muscle strain takes 1-2 weeks. Complete healing normally takes 5-6 weeks. This condition is first treated with PRICE therapy. This involves:  Protecting your muscle from being injured again.  Resting your injured muscle.  Icing your injured muscle.  Applying pressure (compression) to your injured  muscle. This may be done with a splint or elastic bandage.  Raising (elevating) your injured muscle. Your doctor may also recommend medicine for pain. Follow these instructions at home: If you have a splint:  Wear the splint as told by your doctor. Take it off only as told by your doctor.  Loosen the splint if your fingers or toes tingle, get numb, or turn cold and blue.  Keep the splint clean.  If the splint is not waterproof: ? Do not let it get wet. ? Cover it with a watertight covering when you take a bath or a shower. Managing pain, stiffness, and swelling   If directed, put ice on your injured area. ? If you  have a removable splint, take it off as told by your doctor. ? Put ice in a plastic bag. ? Place a towel between your skin and the bag. ? Leave the ice on for 20 minutes, 2-3 times a day.  Move your fingers or toes often. This helps to avoid stiffness and lessen swelling.  Raise your injured area above the level of your heart while you are sitting or lying down.  Wear an elastic bandage as told by your doctor. Make sure it is not too tight. General instructions  Take over-the-counter and prescription medicines only as told by your doctor.  Limit your activity. Rest your injured muscle as told by your doctor. Your doctor may say that gentle movements are okay.  If physical therapy was prescribed, do exercises as told by your doctor.  Do not put pressure on any part of the splint until it is fully hardened. This may take many hours.  Do not use any products that contain nicotine or tobacco, such as cigarettes and e-cigarettes. These can delay bone healing. If you need help quitting, ask your doctor.  Warm up before you exercise. This helps to prevent more muscle strains.  Ask your doctor when it is safe to drive if you have a splint.  Keep all follow-up visits as told by your doctor. This is important. Contact a doctor if:  You have more pain or swelling in your injured area. Get help right away if:  You have any of these problems in your injured area: ? You have numbness. ? You have tingling. ? You lose a lot of strength. Summary  A muscle strain is an injury that happens when a muscle is stretched longer than normal.  This condition is first treated with PRICE therapy. This includes protecting, resting, icing, adding pressure, and raising your injury.  Limit your activity. Rest your injured muscle as told by your doctor. Your doctor may say that gentle movements are okay.  Warm up before you exercise. This helps to prevent more muscle strains. This information is not  intended to replace advice given to you by your health care provider. Make sure you discuss any questions you have with your health care provider. Document Revised: 08/12/2018 Document Reviewed: 07/23/2016 Elsevier Patient Education  [place].

## 2019-12-16 ENCOUNTER — Other Ambulatory Visit: Payer: Self-pay | Admitting: Family Medicine

## 2019-12-16 NOTE — Telephone Encounter (Signed)
Looks like Rx was sent to [facility]. Already. Called pt and pharmacy to see if Rx was picked up no answer from pt. However, pharmacist stated that pt Rx was suppose to be filled at [facility]. And they do not have Vit D refills on hand. Will go a head and send Rx to [facility].

## 2019-12-20 ENCOUNTER — Telehealth: Payer: Self-pay | Admitting: *Deleted

## 2019-12-20 ENCOUNTER — Other Ambulatory Visit: Payer: Self-pay | Admitting: Family Medicine

## 2019-12-20 MED ORDER — SERTRALINE HCL 25 MG PO TABS: 25.0000 mg | ORAL_TABLET | Freq: Every day | ORAL | 1 refills | Status: DC

## 2019-12-20 NOTE — Telephone Encounter (Signed)
Rx sent. Please remind her that she is due for f/u next month. Thanks, BJ

## 2019-12-20 NOTE — Telephone Encounter (Signed)
Refill request for Sertraline 25 mg tablet, #[phone]

## 2019-12-21 NOTE — Telephone Encounter (Signed)
Left detailed message for patient to call the office to schedule follow-up appointment.

## 2020-01-10 ENCOUNTER — Other Ambulatory Visit: Payer: Self-pay | Admitting: Family Medicine

## 2020-01-30 ENCOUNTER — Other Ambulatory Visit: Payer: Self-pay

## 2020-01-30 MED ORDER — VITAMIN D (ERGOCALCIFEROL) 1.25 MG (50000 UNIT) PO CAPS: ORAL_CAPSULE | ORAL | 1 refills | Status: DC

## 2020-03-23 ENCOUNTER — Other Ambulatory Visit: Payer: BC Managed Care – PPO

## 2020-03-26 ENCOUNTER — Other Ambulatory Visit: Payer: Self-pay

## 2020-03-26 ENCOUNTER — Other Ambulatory Visit: Payer: BC Managed Care – PPO

## 2020-03-26 DIAGNOSIS — Z20822 Contact with and (suspected) exposure to covid-19: Secondary | ICD-10-CM

## 2020-03-27 LAB — SARS-COV-2, NAA 2 DAY TAT

## 2020-03-27 LAB — NOVEL CORONAVIRUS, NAA: SARS-CoV-2, NAA: NOT DETECTED

## 2020-05-07 ENCOUNTER — Other Ambulatory Visit: Payer: Self-pay | Admitting: Obstetrics and Gynecology

## 2020-05-07 DIAGNOSIS — Z1231 Encounter for screening mammogram for malignant neoplasm of breast: Secondary | ICD-10-CM

## 2020-05-08 DIAGNOSIS — H0102A Squamous blepharitis right eye, upper and lower eyelids: Secondary | ICD-10-CM | POA: Diagnosis not present

## 2020-05-08 DIAGNOSIS — H0102B Squamous blepharitis left eye, upper and lower eyelids: Secondary | ICD-10-CM | POA: Diagnosis not present

## 2020-05-08 DIAGNOSIS — H17822 Peripheral opacity of cornea, left eye: Secondary | ICD-10-CM | POA: Diagnosis not present

## 2020-05-08 DIAGNOSIS — H40053 Ocular hypertension, bilateral: Secondary | ICD-10-CM | POA: Diagnosis not present

## 2020-06-15 ENCOUNTER — Ambulatory Visit: Payer: BC Managed Care – PPO

## 2020-06-18 ENCOUNTER — Other Ambulatory Visit: Payer: Self-pay

## 2020-06-18 ENCOUNTER — Ambulatory Visit
Admission: RE | Admit: 2020-06-18 | Discharge: 2020-06-18 | Disposition: A | Discharge status: Home / Self Care | Payer: BC Managed Care – PPO | Source: Ambulatory Visit | Attending: Obstetrics and Gynecology | Admitting: Obstetrics and Gynecology

## 2020-06-18 DIAGNOSIS — Z1231 Encounter for screening mammogram for malignant neoplasm of breast: Secondary | ICD-10-CM | POA: Diagnosis not present

## 2020-07-07 DIAGNOSIS — Z1152 Encounter for screening for COVID-19: Secondary | ICD-10-CM | POA: Diagnosis not present

## 2020-07-24 ENCOUNTER — Ambulatory Visit: Payer: BC Managed Care – PPO

## 2020-08-01 DIAGNOSIS — H2513 Age-related nuclear cataract, bilateral: Secondary | ICD-10-CM | POA: Diagnosis not present

## 2020-08-01 DIAGNOSIS — H40053 Ocular hypertension, bilateral: Secondary | ICD-10-CM | POA: Diagnosis not present

## 2020-08-01 DIAGNOSIS — H0102A Squamous blepharitis right eye, upper and lower eyelids: Secondary | ICD-10-CM | POA: Diagnosis not present

## 2020-08-01 DIAGNOSIS — H3552 Pigmentary retinal dystrophy: Secondary | ICD-10-CM | POA: Diagnosis not present

## 2020-09-07 ENCOUNTER — Emergency Department (HOSPITAL_COMMUNITY): Payer: BC Managed Care – PPO

## 2020-09-07 ENCOUNTER — Inpatient Hospital Stay (HOSPITAL_COMMUNITY)
Admission: EM | Admit: 2020-09-07 | Discharge: 2020-09-10 | DRG: 513 | Disposition: A | Discharge status: Home / Self Care | Payer: BC Managed Care – PPO | Attending: Surgery | Admitting: Surgery

## 2020-09-07 ENCOUNTER — Encounter (HOSPITAL_COMMUNITY): Payer: Self-pay | Admitting: *Deleted

## 2020-09-07 ENCOUNTER — Inpatient Hospital Stay (HOSPITAL_COMMUNITY): Payer: BC Managed Care – PPO

## 2020-09-07 ENCOUNTER — Other Ambulatory Visit: Payer: Self-pay

## 2020-09-07 DIAGNOSIS — E559 Vitamin D deficiency, unspecified: Secondary | ICD-10-CM | POA: Diagnosis not present

## 2020-09-07 DIAGNOSIS — R519 Headache, unspecified: Secondary | ICD-10-CM | POA: Diagnosis not present

## 2020-09-07 DIAGNOSIS — H409 Unspecified glaucoma: Secondary | ICD-10-CM | POA: Diagnosis not present

## 2020-09-07 DIAGNOSIS — Z09 Encounter for follow-up examination after completed treatment for conditions other than malignant neoplasm: Secondary | ICD-10-CM

## 2020-09-07 DIAGNOSIS — S62306A Unspecified fracture of fifth metacarpal bone, right hand, initial encounter for closed fracture: Secondary | ICD-10-CM | POA: Diagnosis not present

## 2020-09-07 DIAGNOSIS — Y92410 Unspecified street and highway as the place of occurrence of the external cause: Secondary | ICD-10-CM

## 2020-09-07 DIAGNOSIS — S42009A Fracture of unspecified part of unspecified clavicle, initial encounter for closed fracture: Secondary | ICD-10-CM

## 2020-09-07 DIAGNOSIS — Z23 Encounter for immunization: Secondary | ICD-10-CM | POA: Diagnosis not present

## 2020-09-07 DIAGNOSIS — S42021A Displaced fracture of shaft of right clavicle, initial encounter for closed fracture: Principal | ICD-10-CM | POA: Diagnosis present

## 2020-09-07 DIAGNOSIS — S62396A Other fracture of fifth metacarpal bone, right hand, initial encounter for closed fracture: Secondary | ICD-10-CM | POA: Diagnosis present

## 2020-09-07 DIAGNOSIS — S0081XA Abrasion of other part of head, initial encounter: Secondary | ICD-10-CM | POA: Diagnosis present

## 2020-09-07 DIAGNOSIS — F419 Anxiety disorder, unspecified: Secondary | ICD-10-CM | POA: Diagnosis not present

## 2020-09-07 DIAGNOSIS — Z8616 Personal history of COVID-19: Secondary | ICD-10-CM

## 2020-09-07 DIAGNOSIS — S3993XA Unspecified injury of pelvis, initial encounter: Secondary | ICD-10-CM | POA: Diagnosis not present

## 2020-09-07 DIAGNOSIS — S42001A Fracture of unspecified part of right clavicle, initial encounter for closed fracture: Secondary | ICD-10-CM

## 2020-09-07 DIAGNOSIS — S6992XA Unspecified injury of left wrist, hand and finger(s), initial encounter: Secondary | ICD-10-CM | POA: Diagnosis not present

## 2020-09-07 DIAGNOSIS — S80211A Abrasion, right knee, initial encounter: Secondary | ICD-10-CM | POA: Diagnosis present

## 2020-09-07 DIAGNOSIS — S2249XA Multiple fractures of ribs, unspecified side, initial encounter for closed fracture: Secondary | ICD-10-CM | POA: Diagnosis not present

## 2020-09-07 DIAGNOSIS — E785 Hyperlipidemia, unspecified: Secondary | ICD-10-CM | POA: Diagnosis not present

## 2020-09-07 DIAGNOSIS — S6991XA Unspecified injury of right wrist, hand and finger(s), initial encounter: Secondary | ICD-10-CM

## 2020-09-07 DIAGNOSIS — I10 Essential (primary) hypertension: Secondary | ICD-10-CM | POA: Diagnosis not present

## 2020-09-07 DIAGNOSIS — S2241XA Multiple fractures of ribs, right side, initial encounter for closed fracture: Secondary | ICD-10-CM | POA: Diagnosis not present

## 2020-09-07 DIAGNOSIS — Z6841 Body Mass Index (BMI) 40.0 and over, adult: Secondary | ICD-10-CM

## 2020-09-07 DIAGNOSIS — M722 Plantar fascial fibromatosis: Secondary | ICD-10-CM | POA: Diagnosis not present

## 2020-09-07 DIAGNOSIS — S060X0A Concussion without loss of consciousness, initial encounter: Secondary | ICD-10-CM | POA: Diagnosis not present

## 2020-09-07 DIAGNOSIS — T1490XA Injury, unspecified, initial encounter: Secondary | ICD-10-CM

## 2020-09-07 DIAGNOSIS — S3991XA Unspecified injury of abdomen, initial encounter: Secondary | ICD-10-CM | POA: Diagnosis not present

## 2020-09-07 DIAGNOSIS — R000 Tachycardia, unspecified: Secondary | ICD-10-CM | POA: Diagnosis not present

## 2020-09-07 DIAGNOSIS — M546 Pain in thoracic spine: Secondary | ICD-10-CM | POA: Diagnosis not present

## 2020-09-07 DIAGNOSIS — S42012A Anterior displaced fracture of sternal end of left clavicle, initial encounter for closed fracture: Secondary | ICD-10-CM | POA: Diagnosis not present

## 2020-09-07 DIAGNOSIS — M7989 Other specified soft tissue disorders: Secondary | ICD-10-CM | POA: Diagnosis not present

## 2020-09-07 DIAGNOSIS — U071 COVID-19: Secondary | ICD-10-CM | POA: Diagnosis not present

## 2020-09-07 DIAGNOSIS — R404 Transient alteration of awareness: Secondary | ICD-10-CM | POA: Diagnosis not present

## 2020-09-07 DIAGNOSIS — R220 Localized swelling, mass and lump, head: Secondary | ICD-10-CM | POA: Diagnosis not present

## 2020-09-07 DIAGNOSIS — S0003XA Contusion of scalp, initial encounter: Secondary | ICD-10-CM | POA: Diagnosis not present

## 2020-09-07 HISTORY — DX: Congenital malformation of retina: Q14.1

## 2020-09-07 HISTORY — DX: Unspecified glaucoma: H40.9

## 2020-09-07 LAB — TROPONIN I (HIGH SENSITIVITY)
Troponin I (High Sensitivity): 439 ng/L (ref <18)
Troponin I (High Sensitivity): 397 ng/L (ref <18)
Troponin I (High Sensitivity): 323 ng/L (ref <18)

## 2020-09-07 LAB — CBC WITH DIFFERENTIAL/PLATELET
Abs Immature Granulocytes: 0.1 10*3/uL — ABNORMAL HIGH (ref 0.00–0.07)
Basophils Absolute: 0 10*3/uL (ref 0.0–0.1)
Basophils Relative: 0 %
Eosinophils Absolute: 0.1 10*3/uL (ref 0.0–0.5)
Eosinophils Relative: 1 %
HCT: 41.4 % (ref 36.0–46.0)
Hemoglobin: 13.3 g/dL (ref 12.0–15.0)
Immature Granulocytes: 1 %
Lymphocytes Relative: 35 %
Lymphs Abs: 2.9 10*3/uL (ref 0.7–4.0)
MCH: 29.6 pg (ref 26.0–34.0)
MCHC: 32.1 g/dL (ref 30.0–36.0)
MCV: 92.2 fL (ref 80.0–100.0)
Monocytes Absolute: 0.5 10*3/uL (ref 0.1–1.0)
Monocytes Relative: 6 %
Neutro Abs: 4.8 10*3/uL (ref 1.7–7.7)
Neutrophils Relative %: 57 %
Platelets: 304 10*3/uL (ref 150–400)
RBC: 4.49 MIL/uL (ref 3.87–5.11)
RDW: 13.7 % (ref 11.5–15.5)
WBC: 8.4 10*3/uL (ref 4.0–10.5)
nRBC: 0 % (ref 0.0–0.2)

## 2020-09-07 LAB — PROTIME-INR
INR: 1 (ref 0.8–1.2)
Prothrombin Time: 13 seconds (ref 11.4–15.2)

## 2020-09-07 LAB — I-STAT CHEM 8, ED
BUN: 15 mg/dL (ref 6–20)
Calcium, Ion: 1.06 mmol/L — ABNORMAL LOW (ref 1.15–1.40)
Chloride: 108 mmol/L (ref 98–111)
Creatinine, Ser: 0.6 mg/dL (ref 0.44–1.00)
Glucose, Bld: 113 mg/dL — ABNORMAL HIGH (ref 70–99)
HCT: 40 % (ref 36.0–46.0)
Hemoglobin: 13.6 g/dL (ref 12.0–15.0)
Potassium: 5.4 mmol/L — ABNORMAL HIGH (ref 3.5–5.1)
Sodium: 139 mmol/L (ref 135–145)
TCO2: 25 mmol/L (ref 22–32)

## 2020-09-07 LAB — HIV ANTIBODY (ROUTINE TESTING W REFLEX): HIV Screen 4th Generation wRfx: NONREACTIVE

## 2020-09-07 LAB — SARS CORONAVIRUS 2 (TAT 6-24 HRS): SARS Coronavirus 2: POSITIVE — AB

## 2020-09-07 MED ORDER — METHOCARBAMOL 500 MG PO TABS
1000.0000 mg | ORAL_TABLET | Freq: Three times a day (TID) | ORAL | Status: DC
  Administered 2020-09-07 – 2020-09-10 (×8): 1000 mg via ORAL
  Filled 2020-09-07 (×8): qty 2

## 2020-09-07 MED ORDER — MORPHINE SULFATE (PF) 4 MG/ML IV SOLN
4.0000 mg | INTRAVENOUS | Status: DC | PRN
  Administered 2020-09-10: 4 mg via INTRAVENOUS
  Filled 2020-09-07: qty 1

## 2020-09-07 MED ORDER — FENTANYL CITRATE (PF) 100 MCG/2ML IJ SOLN
50.0000 ug | Freq: Once | INTRAMUSCULAR | Status: AC
  Administered 2020-09-07: 50 ug via INTRAVENOUS
  Filled 2020-09-07: qty 2

## 2020-09-07 MED ORDER — LATANOPROST 0.005 % OP SOLN
1.0000 [drp] | Freq: Every day | OPHTHALMIC | Status: DC
  Administered 2020-09-07 – 2020-09-08 (×2): 1 [drp] via OPHTHALMIC
  Filled 2020-09-07: qty 2.5

## 2020-09-07 MED ORDER — ONDANSETRON 4 MG PO TBDP: 4.0000 mg | ORAL_TABLET | Freq: Four times a day (QID) | ORAL | Status: DC | PRN

## 2020-09-07 MED ORDER — POVIDONE-IODINE 10 % EX SWAB: 2.0000 "application " | Freq: Once | CUTANEOUS | Status: DC

## 2020-09-07 MED ORDER — ENOXAPARIN SODIUM 30 MG/0.3ML ~~LOC~~ SOLN: 30.0000 mg | Freq: Two times a day (BID) | SUBCUTANEOUS | Status: DC

## 2020-09-07 MED ORDER — LORAZEPAM 2 MG/ML IJ SOLN
0.5000 mg | Freq: Once | INTRAMUSCULAR | Status: AC
  Administered 2020-09-07: 0.5 mg via INTRAVENOUS
  Filled 2020-09-07: qty 1

## 2020-09-07 MED ORDER — ONDANSETRON HCL 4 MG/2ML IJ SOLN: 4.0000 mg | Freq: Four times a day (QID) | INTRAMUSCULAR | Status: DC | PRN

## 2020-09-07 MED ORDER — KETOROLAC TROMETHAMINE 15 MG/ML IJ SOLN
30.0000 mg | Freq: Four times a day (QID) | INTRAMUSCULAR | Status: DC
  Administered 2020-09-07 – 2020-09-10 (×11): 30 mg via INTRAVENOUS
  Filled 2020-09-07 (×11): qty 2

## 2020-09-07 MED ORDER — DOCUSATE SODIUM 100 MG PO CAPS
100.0000 mg | ORAL_CAPSULE | Freq: Two times a day (BID) | ORAL | Status: DC
  Administered 2020-09-07 – 2020-09-10 (×4): 100 mg via ORAL
  Filled 2020-09-07 (×4): qty 1

## 2020-09-07 MED ORDER — DIPHENHYDRAMINE HCL 50 MG/ML IJ SOLN
12.5000 mg | INTRAMUSCULAR | Status: DC | PRN
  Administered 2020-09-07: 25 mg via INTRAVENOUS
  Filled 2020-09-07: qty 1

## 2020-09-07 MED ORDER — OXYCODONE HCL 5 MG PO TABS
5.0000 mg | ORAL_TABLET | ORAL | Status: DC | PRN
  Administered 2020-09-08: 5 mg via ORAL
  Administered 2020-09-08 – 2020-09-10 (×3): 10 mg via ORAL
  Filled 2020-09-07 (×3): qty 2
  Filled 2020-09-07: qty 1

## 2020-09-07 MED ORDER — BACITRACIN ZINC 500 UNIT/GM EX OINT
TOPICAL_OINTMENT | Freq: Two times a day (BID) | CUTANEOUS | Status: DC
  Filled 2020-09-07: qty 28.4

## 2020-09-07 MED ORDER — CEFAZOLIN SODIUM-DEXTROSE 2-4 GM/100ML-% IV SOLN
2.0000 g | INTRAVENOUS | Status: AC
  Administered 2020-09-08: 2 g via INTRAVENOUS
  Filled 2020-09-07 (×2): qty 100

## 2020-09-07 MED ORDER — CHLORHEXIDINE GLUCONATE 4 % EX LIQD
60.0000 mL | Freq: Once | CUTANEOUS | Status: AC
  Administered 2020-09-08: 4 via TOPICAL
  Filled 2020-09-07: qty 60

## 2020-09-07 MED ORDER — TETANUS-DIPHTH-ACELL PERTUSSIS 5-2.5-18.5 LF-MCG/0.5 IM SUSY
0.5000 mL | PREFILLED_SYRINGE | Freq: Once | INTRAMUSCULAR | Status: AC
  Administered 2020-09-07: 0.5 mL via INTRAMUSCULAR
  Filled 2020-09-07: qty 0.5

## 2020-09-07 MED ORDER — IOHEXOL 300 MG/ML  SOLN
100.0000 mL | Freq: Once | INTRAMUSCULAR | Status: AC | PRN
  Administered 2020-09-07: 100 mL via INTRAVENOUS

## 2020-09-07 MED ORDER — BUSPIRONE HCL 5 MG PO TABS
5.0000 mg | ORAL_TABLET | Freq: Three times a day (TID) | ORAL | Status: DC
  Administered 2020-09-07 – 2020-09-10 (×6): 5 mg via ORAL
  Filled 2020-09-07 (×6): qty 1

## 2020-09-07 MED ORDER — LACTATED RINGERS IV BOLUS
1000.0000 mL | Freq: Once | INTRAVENOUS | Status: AC
  Administered 2020-09-07: 1000 mL via INTRAVENOUS

## 2020-09-07 MED ORDER — ACETAMINOPHEN 500 MG PO TABS
1000.0000 mg | ORAL_TABLET | Freq: Four times a day (QID) | ORAL | Status: DC
  Administered 2020-09-07 – 2020-09-10 (×11): 1000 mg via ORAL
  Filled 2020-09-07 (×11): qty 2

## 2020-09-07 MED ORDER — LORAZEPAM 2 MG/ML IJ SOLN: 0.2500 mg | Freq: Once | INTRAMUSCULAR | Status: DC

## 2020-09-07 MED ORDER — LACTATED RINGERS IV SOLN: INTRAVENOUS | Status: DC

## 2020-09-07 NOTE — Progress Notes (Signed)
Orthopedic Tech Progress Note Patient Details:  Tammy Tucker December 20, 1960 [id number] Level 2 trauma Patient ID: [name], female   DOB: 03/12/1961, 60 y.o.   MRN: [id number]   [name] 09/07/2020, 2:14 PM
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
## 2020-09-07 NOTE — ED Notes (Signed)
Floor charge nurse requests that pt not be brought to floor until 1920

## 2020-09-07 NOTE — ED Notes (Signed)
Called her mother Barbaraann Share [phone] update on patient given at patient request.

## 2020-09-07 NOTE — Plan of Care (Signed)
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
## 2020-09-07 NOTE — ED Notes (Signed)
Paged Dr. Redmond Pulling to see if MD wants serial troponin blood draws.
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
## 2020-09-07 NOTE — ED Notes (Signed)
Patient transported to X-ray 

## 2020-09-07 NOTE — ED Notes (Signed)
Dinner Tray Ordered @ [phone].
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
## 2020-09-08 ENCOUNTER — Encounter (HOSPITAL_COMMUNITY): Payer: Self-pay

## 2020-09-08 ENCOUNTER — Inpatient Hospital Stay (HOSPITAL_COMMUNITY): Payer: BC Managed Care – PPO | Admitting: Certified Registered"

## 2020-09-08 ENCOUNTER — Other Ambulatory Visit: Payer: Self-pay

## 2020-09-08 ENCOUNTER — Inpatient Hospital Stay (HOSPITAL_COMMUNITY): Payer: BC Managed Care – PPO

## 2020-09-08 ENCOUNTER — Encounter (HOSPITAL_COMMUNITY): Admission: EM | Disposition: A | Discharge status: Home / Self Care | Payer: Self-pay | Source: Home / Self Care

## 2020-09-08 HISTORY — PX: ORIF CLAVICULAR FRACTURE: SHX5055

## 2020-09-08 LAB — CBC
HCT: 36.6 % (ref 36.0–46.0)
Hemoglobin: 12.3 g/dL (ref 12.0–15.0)
MCH: 30.3 pg (ref 26.0–34.0)
MCHC: 33.6 g/dL (ref 30.0–36.0)
MCV: 90.1 fL (ref 80.0–100.0)
Platelets: 232 10*3/uL (ref 150–400)
RBC: 4.06 MIL/uL (ref 3.87–5.11)
RDW: 13.7 % (ref 11.5–15.5)
WBC: 7.8 10*3/uL (ref 4.0–10.5)
nRBC: 0 % (ref 0.0–0.2)

## 2020-09-08 LAB — BASIC METABOLIC PANEL
Anion gap: 8 (ref 5–15)
BUN: 7 mg/dL (ref 6–20)
CO2: 25 mmol/L (ref 22–32)
Calcium: 9.3 mg/dL (ref 8.9–10.3)
Chloride: 107 mmol/L (ref 98–111)
Creatinine, Ser: 0.72 mg/dL (ref 0.44–1.00)
GFR, Estimated: >60 mL/min (ref >60)
Glucose, Bld: 104 mg/dL — ABNORMAL HIGH (ref 70–99)
Potassium: 3.4 mmol/L — ABNORMAL LOW (ref 3.5–5.1)
Sodium: 140 mmol/L (ref 135–145)

## 2020-09-08 LAB — SURGICAL PCR SCREEN
MRSA, PCR: NEGATIVE
Staphylococcus aureus: NEGATIVE

## 2020-09-08 SURGERY — OPEN REDUCTION INTERNAL FIXATION (ORIF) CLAVICULAR FRACTURE
Anesthesia: General | Laterality: Right

## 2020-09-08 MED ORDER — VANCOMYCIN HCL 1000 MG IV SOLR
INTRAVENOUS | Status: DC | PRN
  Administered 2020-09-08: 1000 mg via TOPICAL

## 2020-09-08 MED ORDER — ROCURONIUM BROMIDE 10 MG/ML (PF) SYRINGE
PREFILLED_SYRINGE | INTRAVENOUS | Status: AC
  Filled 2020-09-08: qty 10

## 2020-09-08 MED ORDER — ROCURONIUM BROMIDE 10 MG/ML (PF) SYRINGE
PREFILLED_SYRINGE | INTRAVENOUS | Status: DC | PRN
  Administered 2020-09-08: 50 mg via INTRAVENOUS

## 2020-09-08 MED ORDER — PROPOFOL 10 MG/ML IV BOLUS
INTRAVENOUS | Status: DC | PRN
  Administered 2020-09-08 (×2): 50 mg via INTRAVENOUS
  Administered 2020-09-08: 130 mg via INTRAVENOUS

## 2020-09-08 MED ORDER — DEXAMETHASONE SODIUM PHOSPHATE 10 MG/ML IJ SOLN
INTRAMUSCULAR | Status: AC
  Filled 2020-09-08: qty 1

## 2020-09-08 MED ORDER — EPHEDRINE 5 MG/ML INJ
INTRAVENOUS | Status: AC
  Filled 2020-09-08: qty 10

## 2020-09-08 MED ORDER — LIDOCAINE 2% (20 MG/ML) 5 ML SYRINGE
INTRAMUSCULAR | Status: DC | PRN
  Administered 2020-09-08: 60 mg via INTRAVENOUS

## 2020-09-08 MED ORDER — DEXAMETHASONE SODIUM PHOSPHATE 10 MG/ML IJ SOLN
INTRAMUSCULAR | Status: DC | PRN
  Administered 2020-09-08: 5 mg via INTRAVENOUS

## 2020-09-08 MED ORDER — FENTANYL CITRATE (PF) 250 MCG/5ML IJ SOLN
INTRAMUSCULAR | Status: AC
  Filled 2020-09-08: qty 5

## 2020-09-08 MED ORDER — PHENYLEPHRINE 40 MCG/ML (10ML) SYRINGE FOR IV PUSH (FOR BLOOD PRESSURE SUPPORT)
PREFILLED_SYRINGE | INTRAVENOUS | Status: AC
  Filled 2020-09-08: qty 10

## 2020-09-08 MED ORDER — ENOXAPARIN SODIUM 30 MG/0.3ML ~~LOC~~ SOLN
30.0000 mg | Freq: Two times a day (BID) | SUBCUTANEOUS | Status: DC
  Administered 2020-09-09 – 2020-09-10 (×2): 30 mg via SUBCUTANEOUS
  Filled 2020-09-08 (×2): qty 0.3

## 2020-09-08 MED ORDER — PROPOFOL 10 MG/ML IV BOLUS
INTRAVENOUS | Status: AC
  Filled 2020-09-08: qty 20

## 2020-09-08 MED ORDER — MIDAZOLAM HCL 2 MG/2ML IJ SOLN
INTRAMUSCULAR | Status: DC | PRN
  Administered 2020-09-08: 2 mg via INTRAVENOUS

## 2020-09-08 MED ORDER — HYDROMORPHONE HCL 1 MG/ML IJ SOLN
INTRAMUSCULAR | Status: AC
  Filled 2020-09-08: qty 1

## 2020-09-08 MED ORDER — EPHEDRINE SULFATE-NACL 50-0.9 MG/10ML-% IV SOSY
PREFILLED_SYRINGE | INTRAVENOUS | Status: DC | PRN
  Administered 2020-09-08: 5 mg via INTRAVENOUS

## 2020-09-08 MED ORDER — ONDANSETRON HCL 4 MG/2ML IJ SOLN
INTRAMUSCULAR | Status: DC | PRN
  Administered 2020-09-08: 4 mg via INTRAVENOUS

## 2020-09-08 MED ORDER — BUPIVACAINE HCL (PF) 0.25 % IJ SOLN
INTRAMUSCULAR | Status: DC | PRN
  Administered 2020-09-08: 20 mL

## 2020-09-08 MED ORDER — ONDANSETRON HCL 4 MG/2ML IJ SOLN
INTRAMUSCULAR | Status: AC
  Filled 2020-09-08: qty 2

## 2020-09-08 MED ORDER — SUGAMMADEX SODIUM 200 MG/2ML IV SOLN
INTRAVENOUS | Status: DC | PRN
  Administered 2020-09-08: 200 mg via INTRAVENOUS

## 2020-09-08 MED ORDER — VANCOMYCIN HCL 1000 MG IV SOLR
INTRAVENOUS | Status: AC
  Filled 2020-09-08: qty 1000

## 2020-09-08 MED ORDER — LIDOCAINE 2% (20 MG/ML) 5 ML SYRINGE
INTRAMUSCULAR | Status: AC
  Filled 2020-09-08: qty 5

## 2020-09-08 MED ORDER — PHENYLEPHRINE 40 MCG/ML (10ML) SYRINGE FOR IV PUSH (FOR BLOOD PRESSURE SUPPORT)
PREFILLED_SYRINGE | INTRAVENOUS | Status: DC | PRN
  Administered 2020-09-08 (×3): 80 ug via INTRAVENOUS
  Administered 2020-09-08: 40 ug via INTRAVENOUS

## 2020-09-08 MED ORDER — HYDROMORPHONE HCL 1 MG/ML IJ SOLN
0.2500 mg | INTRAMUSCULAR | Status: DC | PRN
Start: 2020-09-08 — End: 2020-09-08
  Administered 2020-09-08: 0.5 mg via INTRAVENOUS

## 2020-09-08 MED ORDER — PHENYLEPHRINE HCL-NACL 10-0.9 MG/250ML-% IV SOLN
INTRAVENOUS | Status: DC | PRN
  Administered 2020-09-08: 20 ug/min via INTRAVENOUS

## 2020-09-08 MED ORDER — FENTANYL CITRATE (PF) 250 MCG/5ML IJ SOLN
INTRAMUSCULAR | Status: DC | PRN
  Administered 2020-09-08: 50 ug via INTRAVENOUS
  Administered 2020-09-08: 100 ug via INTRAVENOUS

## 2020-09-08 MED ORDER — BUPIVACAINE HCL (PF) 0.25 % IJ SOLN
INTRAMUSCULAR | Status: AC
  Filled 2020-09-08: qty 30

## 2020-09-08 MED ORDER — MIDAZOLAM HCL 2 MG/2ML IJ SOLN
INTRAMUSCULAR | Status: AC
  Filled 2020-09-08: qty 2

## 2020-09-08 SURGICAL SUPPLY — 67 items
AID PSTN UNV HD RSTRNT DISP (MISCELLANEOUS) ×1
APL PRP STRL LF DISP 70% ISPRP (MISCELLANEOUS) ×1
BIT DRILL 2 CANN GRADUATED (BIT) ×1 IMPLANT
BIT DRILL 2.5 CANN STRL (BIT) ×1 IMPLANT
BLADE AVERAGE 25X9 (BLADE) IMPLANT
BLADE SURG 10 STRL SS (BLADE) ×1 IMPLANT
BLADE SURG 11 STRL SS (BLADE) ×1 IMPLANT
BNDG COHESIVE 4X5 TAN STRL (GAUZE/BANDAGES/DRESSINGS) IMPLANT
CANNULA PASSPORT BUTTON 10-40 (CANNULA) ×1 IMPLANT
CHLORAPREP W/TINT 26 (MISCELLANEOUS) ×2 IMPLANT
CLSR STERI-STRIP ANTIMIC 1/2X4 (GAUZE/BANDAGES/DRESSINGS) ×2 IMPLANT
COVER LIGHT HANDLE UNIVERSAL (MISCELLANEOUS) ×1 IMPLANT
COVER WAND RF STERILE (DRAPES) IMPLANT
DECANTER SPIKE VIAL GLASS SM (MISCELLANEOUS) IMPLANT
DRAPE C-ARM 42X72 X-RAY (DRAPES) ×2 IMPLANT
DRAPE IMP U-DRAPE 54X76 (DRAPES) ×2 IMPLANT
DRAPE STERI 35X30 U-POUCH (DRAPES) ×1 IMPLANT
DRAPE U-SHAPE 76X120 STRL (DRAPES) ×4 IMPLANT
DRESSING AQUACEL AG SP 3.5X6 (GAUZE/BANDAGES/DRESSINGS) IMPLANT
DRSG AQUACEL AG ADV 3.5X 6 (GAUZE/BANDAGES/DRESSINGS) ×1 IMPLANT
DRSG AQUACEL AG SP 3.5X6 (GAUZE/BANDAGES/DRESSINGS) ×2
DRSG PAD ABDOMINAL 8X10 ST (GAUZE/BANDAGES/DRESSINGS) ×3 IMPLANT
DW OUTFLOW CASSETTE/TUBE SET (MISCELLANEOUS) ×1 IMPLANT
ELECT REM PT RETURN 9FT ADLT (ELECTROSURGICAL) ×2
ELECTRODE REM PT RTRN 9FT ADLT (ELECTROSURGICAL) ×1 IMPLANT
EXCALIBUR CVD 4.0MM X 13CM (MISCELLANEOUS) ×1 IMPLANT
GAUZE SPONGE 4X4 12PLY STRL (GAUZE/BANDAGES/DRESSINGS) ×1 IMPLANT
GLOVE BIO SURGEON STRL SZ 6.5 (GLOVE) ×2 IMPLANT
GLOVE BIO SURGEON STRL SZ8 (GLOVE) ×2 IMPLANT
GLOVE ECLIPSE 8.0 STRL XLNG CF (GLOVE) ×2 IMPLANT
GLOVE SRG 8 PF TXTR STRL LF DI (GLOVE) ×1 IMPLANT
GLOVE SURG UNDER POLY LF SZ6.5 (GLOVE) ×2 IMPLANT
GLOVE SURG UNDER POLY LF SZ8 (GLOVE) ×2
GOWN STRL REUS W/ TWL LRG LVL3 (GOWN DISPOSABLE) ×2 IMPLANT
GOWN STRL REUS W/TWL LRG LVL3 (GOWN DISPOSABLE) ×4
GOWN STRL REUS W/TWL XL LVL3 (GOWN DISPOSABLE) ×2 IMPLANT
KIT BASIN OR (CUSTOM PROCEDURE TRAY) ×2 IMPLANT
KIT STABILIZATION SHOULDER (MISCELLANEOUS) ×1 IMPLANT
LASSO 90 CVE QUICKPAS (DISPOSABLE) IMPLANT
NDL SPNL 18GX3.5 QUINCKE PK (NEEDLE) ×1 IMPLANT
NEEDLE SPNL 18GX3.5 QUINCKE PK (NEEDLE) IMPLANT
PACK ARTHROSCOPY DSU (CUSTOM PROCEDURE TRAY) ×1 IMPLANT
PACK SHOULDER (CUSTOM PROCEDURE TRAY) ×1 IMPLANT
PLATE CLAVICLE FX RT (Plate) ×1 IMPLANT
PORT APPOLLO RF 90DEGREE MULTI (SURGICAL WAND) ×1 IMPLANT
RESTRAINT HEAD UNIVERSAL NS (MISCELLANEOUS) ×2 IMPLANT
SCREW LOW PROFILE 3.5X14 (Screw) ×3 IMPLANT
SCREW NON-LOCKING 3.5X12MM (Screw) ×3 IMPLANT
SCREW TM SS 2.7X14 CORTEX (Screw) ×2 IMPLANT
SHEET MEDIUM DRAPE 40X70 STRL (DRAPES) ×2 IMPLANT
SLING ARM FOAM STRAP LRG (SOFTGOODS) ×1 IMPLANT
SUCTION FRAZIER TIP 8 FR DISP (SUCTIONS) ×2
SUCTION TUBE FRAZIER 8FR DISP (SUCTIONS) IMPLANT
SUPPORT WRAP ARM LG (MISCELLANEOUS) ×1 IMPLANT
SUT FIBERWIRE #2 38 T-5 BLUE (SUTURE)
SUT FIBERWIRE 2-0 18 17.9 3/8 (SUTURE)
SUT MNCRL AB 4-0 PS2 18 (SUTURE) ×2 IMPLANT
SUT VIC AB 0 CT1 18XCR BRD 8 (SUTURE) ×1 IMPLANT
SUT VIC AB 0 CT1 8-18 (SUTURE) ×2
SUT VIC AB 3-0 SH 27 (SUTURE)
SUT VIC AB 3-0 SH 27X BRD (SUTURE) ×1 IMPLANT
SUTURE FIBERWR #2 38 T-5 BLUE (SUTURE) IMPLANT
SUTURE FIBERWR 2-0 18 17.9 3/8 (SUTURE) IMPLANT
TOWEL GREEN STERILE FF (TOWEL DISPOSABLE) ×2 IMPLANT
TUBE CONNECTING 20X1/4 (TUBING) ×2 IMPLANT
TUBE SUCTION HIGH CAP CLEAR NV (SUCTIONS) IMPLANT
TUBING ARTHROSCOPY IRRIG 16FT (MISCELLANEOUS) ×1 IMPLANT

## 2020-09-08 NOTE — Progress Notes (Signed)
OT Cancellation Note  Patient Details Name: Tammy Tucker MRN: [id number] DOB: 04/24/1961   Cancelled Treatment:    Reason Eval/Treat Not Completed: Patient at procedure or test/ unavailable. Pt out of room at procedure. OT will continue to follow when appropriate.   Minus Breeding, MSOT, OTR/L  [facility]  [phone]   Marius Ditch 09/08/2020, 10:59 AM

## 2020-09-08 NOTE — Anesthesia Preprocedure Evaluation (Addendum)
Anesthesia Evaluation  Patient identified by MRN, date of birth, ID band Patient awake    Reviewed: Allergy & Precautions, H&P , NPO status , Patient's Chart, lab work & pertinent test results  Airway Mallampati: II  TM Distance: >3 FB Neck ROM: Full    Dental no notable dental hx. (+) Teeth Intact, Dental Advisory Given   Pulmonary neg pulmonary ROS,    Pulmonary exam normal breath sounds clear to auscultation       Cardiovascular negative cardio ROS   Rhythm:Regular Rate:Normal     Neuro/Psych negative neurological ROS  negative psych ROS   GI/Hepatic negative GI ROS, Neg liver ROS,   Endo/Other  negative endocrine ROSMorbid obesity  Renal/GU negative Renal ROS  negative genitourinary   Musculoskeletal   Abdominal   Peds  Hematology negative hematology ROS (+)   Anesthesia Other Findings   Reproductive/Obstetrics negative OB ROS                            Anesthesia Physical Anesthesia Plan  ASA: II  Anesthesia Plan: General   Post-op Pain Management:    Induction: Intravenous  PONV Risk Score and Plan: 4 or greater and Ondansetron, Dexamethasone and Midazolam  Airway Management Planned: Oral ETT  Additional Equipment:   Intra-op Plan:   Post-operative Plan: Extubation in OR  Informed Consent: I have reviewed the patients History and Physical, chart, labs and discussed the procedure including the risks, benefits and alternatives for the proposed anesthesia with the patient or authorized representative who has indicated his/her understanding and acceptance.     Dental advisory given  Plan Discussed with: CRNA  Anesthesia Plan Comments:         Anesthesia Quick Evaluation
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
## 2020-09-08 NOTE — Anesthesia Procedure Notes (Signed)
Procedure Name: Intubation Date/Time: 09/08/2020 10:43 AM Performed by: Dorthea Cove, CRNA Pre-anesthesia Checklist: Patient identified, Emergency Drugs available, Suction available and Patient being monitored Patient Re-evaluated:Patient Re-evaluated prior to induction Oxygen Delivery Method: Circle system utilized Preoxygenation: Pre-oxygenation with 100% oxygen Induction Type: IV induction Ventilation: Mask ventilation without difficulty and Oral airway inserted - appropriate to patient size Laryngoscope Size: Glidescope and 3 Grade View: Grade I Tube type: Oral Tube size: 7.0 mm Number of attempts: 1 Airway Equipment and Method: Stylet and Oral airway Placement Confirmation: ETT inserted through vocal cords under direct vision,  positive ETCO2 and breath sounds checked- equal and bilateral Secured at: 22 cm Tube secured with: Tape Dental Injury: Teeth and Oropharynx as per pre-operative assessment

## 2020-09-08 NOTE — Progress Notes (Signed)
SLP Cancellation Note  Patient Details Name: Tammy Tucker MRN: [id number] DOB: 01-14-1961   Cancelled treatment:       Reason Eval/Treat Not Completed: Patient at procedure or test/unavailable. Pt for surgery this am. Will f/u when appropriate.    Ramiz Turpin, Katherene Ponto 09/08/2020, 9:25 AM

## 2020-09-08 NOTE — Anesthesia Postprocedure Evaluation (Signed)
Anesthesia Post Note  Patient: BAMBI FEHNEL  Procedure(s) Performed: OPEN REDUCTION INTERNAL FIXATION (ORIF) CLAVICULAR FRACTURE (Right )     Patient location during evaluation: PACU Anesthesia Type: General Level of consciousness: awake and alert Pain management: pain level controlled Vital Signs Assessment: post-procedure vital signs reviewed and stable Respiratory status: spontaneous breathing, nonlabored ventilation, respiratory function stable and patient connected to nasal cannula oxygen Cardiovascular status: blood pressure returned to baseline and stable Postop Assessment: no apparent nausea or vomiting Anesthetic complications: no   No complications documented.  Last Vitals:  Vitals:   09/08/20 1300 09/08/20 1345  BP: (!) 144/93 (!) 141/88  Pulse: 79 67  Resp: 17 18  Temp: 36.9 C 37.1 C  SpO2: 93% 95%    Last Pain:  Vitals:   09/08/20 1345  TempSrc: Oral  PainSc:                  FITZGERALD,W. EDMOND

## 2020-09-08 NOTE — TOC CAGE-AID Note (Signed)
Transition of Care [facility]) - CAGE-AID Screening   Patient Details  Name: Tammy Tucker MRN: [id number] Date of Birth: 1961-05-18   Clinical Narrative: Patient denies alcohol and drug use.   CAGE-AID Screening:    Have You Ever Felt You Ought to Cut Down on Your Drinking or Drug Use?: No Have People Annoyed You By Critizing Your Drinking Or Drug Use?: No Have You Felt Bad Or Guilty About Your Drinking Or Drug Use?: No Have You Ever Had a Drink or Used Drugs First Thing In The Morning to Steady Your Nerves or to Get Rid of a Hangover?: No CAGE-AID Score: 0  Substance Abuse Education Offered: No

## 2020-09-08 NOTE — Transfer of Care (Signed)
Immediate Anesthesia Transfer of Care Note  Patient: Tammy Tucker  Procedure(s) Performed: OPEN REDUCTION INTERNAL FIXATION (ORIF) CLAVICULAR FRACTURE (Right )  Patient Location: PACU  Anesthesia Type:General  Level of Consciousness: awake, drowsy and patient cooperative  Airway & Oxygen Therapy: Patient Spontanous Breathing and Patient connected to face mask oxygen  Post-op Assessment: Report given to RN and Post -op Vital signs reviewed and stable  Post vital signs: Reviewed and stable  Last Vitals:  Vitals Value Taken Time  BP 134/74   Temp    Pulse 87   Resp 16   SpO2 98     Last Pain:  Vitals:   09/08/20 0939  TempSrc: Oral  PainSc:          Complications: No complications documented.

## 2020-09-08 NOTE — Interval H&P Note (Signed)
Discuss case at length with patient. She understands risk infection, nonunion, pain from rib fractures amongst other issues. We will plan for operative fixation of clavicle. Of note she reports that her Covid positivity likely happened two months ago and she had tested positive on a PCR test at the [facility].  Her positive test may be a result of her previous positivity.
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
## 2020-09-08 NOTE — Progress Notes (Signed)
PT Cancellation Note  Patient Details Name: Tammy Tucker MRN: [id number] DOB: Aug 07, 1960   Cancelled Treatment:    Reason Eval/Treat Not Completed: Patient at procedure or test/unavailable . Pt undergoing surgery today.  Will initiate PT tomorrow as appropriate.   Shanna Cisco 09/08/2020, 2:52 PM
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
## 2020-09-09 ENCOUNTER — Encounter (HOSPITAL_COMMUNITY): Admission: EM | Disposition: A | Discharge status: Home / Self Care | Payer: Self-pay | Source: Home / Self Care

## 2020-09-09 ENCOUNTER — Inpatient Hospital Stay (HOSPITAL_COMMUNITY): Payer: BC Managed Care – PPO

## 2020-09-09 ENCOUNTER — Inpatient Hospital Stay (HOSPITAL_COMMUNITY): Payer: BC Managed Care – PPO | Admitting: Certified Registered"

## 2020-09-09 HISTORY — PX: PERCUTANEOUS PINNING: SHX2209

## 2020-09-09 LAB — CBC
HCT: 36.4 % (ref 36.0–46.0)
Hemoglobin: 12.2 g/dL (ref 12.0–15.0)
MCH: 30.2 pg (ref 26.0–34.0)
MCHC: 33.5 g/dL (ref 30.0–36.0)
MCV: 90.1 fL (ref 80.0–100.0)
Platelets: 221 10*3/uL (ref 150–400)
RBC: 4.04 MIL/uL (ref 3.87–5.11)
RDW: 13.6 % (ref 11.5–15.5)
WBC: 7.1 10*3/uL (ref 4.0–10.5)
nRBC: 0 % (ref 0.0–0.2)

## 2020-09-09 LAB — BASIC METABOLIC PANEL
Anion gap: 6 (ref 5–15)
BUN: 6 mg/dL (ref 6–20)
CO2: 26 mmol/L (ref 22–32)
Calcium: 8.7 mg/dL — ABNORMAL LOW (ref 8.9–10.3)
Chloride: 106 mmol/L (ref 98–111)
Creatinine, Ser: 0.66 mg/dL (ref 0.44–1.00)
GFR, Estimated: >60 mL/min (ref >60)
Glucose, Bld: 126 mg/dL — ABNORMAL HIGH (ref 70–99)
Potassium: 3.4 mmol/L — ABNORMAL LOW (ref 3.5–5.1)
Sodium: 138 mmol/L (ref 135–145)

## 2020-09-09 SURGERY — PINNING, EXTREMITY, PERCUTANEOUS
Anesthesia: General | Laterality: Right

## 2020-09-09 MED ORDER — CEFAZOLIN SODIUM-DEXTROSE 2-3 GM-%(50ML) IV SOLR
INTRAVENOUS | Status: DC | PRN
  Administered 2020-09-09: 2 g via INTRAVENOUS

## 2020-09-09 MED ORDER — CEFAZOLIN SODIUM-DEXTROSE 2-4 GM/100ML-% IV SOLN
2.0000 g | INTRAVENOUS | Status: DC
  Filled 2020-09-09 (×2): qty 100

## 2020-09-09 MED ORDER — LABETALOL HCL 5 MG/ML IV SOLN
INTRAVENOUS | Status: AC
  Filled 2020-09-09: qty 4

## 2020-09-09 MED ORDER — FENTANYL CITRATE (PF) 250 MCG/5ML IJ SOLN
INTRAMUSCULAR | Status: AC
  Filled 2020-09-09: qty 5

## 2020-09-09 MED ORDER — BUPIVACAINE HCL (PF) 0.5 % IJ SOLN
INTRAMUSCULAR | Status: DC | PRN
  Administered 2020-09-09: 10 mL

## 2020-09-09 MED ORDER — SUCCINYLCHOLINE CHLORIDE 200 MG/10ML IV SOSY
PREFILLED_SYRINGE | INTRAVENOUS | Status: DC | PRN
  Administered 2020-09-09: 120 mg via INTRAVENOUS

## 2020-09-09 MED ORDER — LACTATED RINGERS IV SOLN: INTRAVENOUS | Status: DC | PRN

## 2020-09-09 MED ORDER — LABETALOL HCL 5 MG/ML IV SOLN
10.0000 mg | INTRAVENOUS | Status: DC | PRN
  Administered 2020-09-09: 10 mg via INTRAVENOUS

## 2020-09-09 MED ORDER — LIDOCAINE 2% (20 MG/ML) 5 ML SYRINGE
INTRAMUSCULAR | Status: DC | PRN
  Administered 2020-09-09: 100 mg via INTRAVENOUS

## 2020-09-09 MED ORDER — ACETAMINOPHEN 500 MG PO TABS
1000.0000 mg | ORAL_TABLET | Freq: Once | ORAL | Status: DC
  Filled 2020-09-09: qty 2

## 2020-09-09 MED ORDER — FENTANYL CITRATE (PF) 250 MCG/5ML IJ SOLN
INTRAMUSCULAR | Status: DC | PRN
  Administered 2020-09-09: 100 ug via INTRAVENOUS

## 2020-09-09 MED ORDER — FENTANYL CITRATE (PF) 100 MCG/2ML IJ SOLN
25.0000 ug | INTRAMUSCULAR | Status: DC | PRN
  Administered 2020-09-09: 50 ug via INTRAVENOUS

## 2020-09-09 MED ORDER — ONDANSETRON HCL 4 MG/2ML IJ SOLN: 4.0000 mg | Freq: Once | INTRAMUSCULAR | Status: DC | PRN

## 2020-09-09 MED ORDER — PROPOFOL 10 MG/ML IV BOLUS
INTRAVENOUS | Status: DC | PRN
  Administered 2020-09-09: 50 mg via INTRAVENOUS
  Administered 2020-09-09: 150 mg via INTRAVENOUS

## 2020-09-09 MED ORDER — CHLORHEXIDINE GLUCONATE 0.12 % MT SOLN
OROMUCOSAL | Status: AC
  Administered 2020-09-09: 15 mL
  Filled 2020-09-09: qty 15

## 2020-09-09 MED ORDER — MIDAZOLAM HCL 2 MG/2ML IJ SOLN
INTRAMUSCULAR | Status: AC
  Filled 2020-09-09: qty 2

## 2020-09-09 MED ORDER — BUPIVACAINE HCL (PF) 0.5 % IJ SOLN
INTRAMUSCULAR | Status: AC
  Filled 2020-09-09: qty 30

## 2020-09-09 MED ORDER — MIDAZOLAM HCL 2 MG/2ML IJ SOLN
INTRAMUSCULAR | Status: DC | PRN
  Administered 2020-09-09: 2 mg via INTRAVENOUS

## 2020-09-09 MED ORDER — EPHEDRINE SULFATE 50 MG/ML IJ SOLN
INTRAMUSCULAR | Status: DC | PRN
  Administered 2020-09-09 (×3): 10 mg via INTRAVENOUS

## 2020-09-09 MED ORDER — FENTANYL CITRATE (PF) 100 MCG/2ML IJ SOLN
INTRAMUSCULAR | Status: AC
  Filled 2020-09-09: qty 2

## 2020-09-09 MED ORDER — PROPOFOL 10 MG/ML IV BOLUS
INTRAVENOUS | Status: AC
  Filled 2020-09-09: qty 20

## 2020-09-09 MED ORDER — DEXAMETHASONE SODIUM PHOSPHATE 10 MG/ML IJ SOLN
INTRAMUSCULAR | Status: DC | PRN
  Administered 2020-09-09: 10 mg via INTRAVENOUS

## 2020-09-09 MED ORDER — CHLORHEXIDINE GLUCONATE 4 % EX LIQD: 60.0000 mL | Freq: Once | CUTANEOUS | Status: DC

## 2020-09-09 SURGICAL SUPPLY — 41 items
BNDG CMPR 9X4 STRL LF SNTH (GAUZE/BANDAGES/DRESSINGS) ×1
BNDG ELASTIC 3X5.8 VLCR STR LF (GAUZE/BANDAGES/DRESSINGS) ×4 IMPLANT
BNDG ELASTIC 4X5.8 VLCR STR LF (GAUZE/BANDAGES/DRESSINGS) ×1 IMPLANT
BNDG ESMARK 4X9 LF (GAUZE/BANDAGES/DRESSINGS) ×3 IMPLANT
BNDG GAUZE ELAST 4 BULKY (GAUZE/BANDAGES/DRESSINGS) ×3 IMPLANT
COVER SURGICAL LIGHT HANDLE (MISCELLANEOUS) ×3 IMPLANT
COVER WAND RF STERILE (DRAPES) ×1 IMPLANT
CUFF TOURN SGL QUICK 18X4 (TOURNIQUET CUFF) ×2 IMPLANT
DRAPE HALF SHEET 40X57 (DRAPES) ×2 IMPLANT
DRAPE U-SHAPE 47X51 STRL (DRAPES) ×5 IMPLANT
ELECT REM PT RETURN 9FT ADLT (ELECTROSURGICAL) ×3
ELECTRODE REM PT RTRN 9FT ADLT (ELECTROSURGICAL) ×1 IMPLANT
GAUZE SPONGE 4X4 12PLY STRL (GAUZE/BANDAGES/DRESSINGS) ×3 IMPLANT
GAUZE XEROFORM 1X8 LF (GAUZE/BANDAGES/DRESSINGS) ×3 IMPLANT
GLOVE BIO SURGEON STRL SZ7 (GLOVE) ×3 IMPLANT
GLOVE BIOGEL PI IND STRL 7.5 (GLOVE) ×1 IMPLANT
GLOVE BIOGEL PI INDICATOR 7.5 (GLOVE) ×2
GLOVE EUDERMIC 6.5 POWDERFREE (GLOVE) ×2 IMPLANT
GLOVE SS BIOGEL STRL SZ 7.5 (GLOVE) ×1 IMPLANT
GLOVE SUPERSENSE BIOGEL SZ 7.5 (GLOVE) ×2
GLOVE SURG UNDER POLY LF SZ7 (GLOVE) ×3 IMPLANT
KIT BASIN OR (CUSTOM PROCEDURE TRAY) ×3 IMPLANT
KIT TURNOVER KIT B (KITS) ×3 IMPLANT
NDL HYPO 25GX1X1/2 BEV (NEEDLE) IMPLANT
NEEDLE HYPO 25GX1X1/2 BEV (NEEDLE) ×3 IMPLANT
NS IRRIG 1000ML POUR BTL (IV SOLUTION) ×3 IMPLANT
PAD ARMBOARD 7.5X6 YLW CONV (MISCELLANEOUS) ×6 IMPLANT
PAD CAST 3X4 CTTN HI CHSV (CAST SUPPLIES) IMPLANT
PADDING CAST COTTON 3X4 STRL (CAST SUPPLIES) ×6
SLEEVE SCD COMPRESS KNEE MED (STOCKING) ×2 IMPLANT
SPLINT CAST 1 STEP 4X15 (MISCELLANEOUS) ×2 IMPLANT
SPONGE LAP 4X18 RFD (DISPOSABLE) ×1 IMPLANT
SUCTION FRAZIER HANDLE 10FR (MISCELLANEOUS)
SUCTION TUBE FRAZIER 10FR DISP (MISCELLANEOUS) IMPLANT
SUT ETHILON 4 0 PS 2 18 (SUTURE) ×1 IMPLANT
SYR CONTROL 10ML LL (SYRINGE) ×2 IMPLANT
TUBE CONNECTING 12'X1/4 (SUCTIONS) ×1
TUBE CONNECTING 12X1/4 (SUCTIONS) ×1 IMPLANT
UNDERPAD 30X36 HEAVY ABSORB (UNDERPADS AND DIAPERS) ×3 IMPLANT
WATER STERILE IRR 1000ML POUR (IV SOLUTION) ×1 IMPLANT
WIRE K 1.1X9 (WIRE) ×4 IMPLANT

## 2020-09-09 NOTE — Anesthesia Postprocedure Evaluation (Signed)
Anesthesia Post Note  Patient: Tammy Tucker  Procedure(s) Performed: PERCUTANEOUS PINNING METACARPAL (Right )     Patient location during evaluation: PACU Anesthesia Type: General Level of consciousness: awake and alert Pain management: pain level controlled Vital Signs Assessment: post-procedure vital signs reviewed and stable Respiratory status: spontaneous breathing, nonlabored ventilation, respiratory function stable and patient connected to nasal cannula oxygen Cardiovascular status: blood pressure returned to baseline and stable Postop Assessment: no apparent nausea or vomiting Anesthetic complications: no   No complications documented.  Last Vitals:  Vitals:   09/09/20 2140 09/09/20 2155  BP: (!) 183/80 (!) 180/85  Pulse: 83 82  Resp: 19 (!) 22  Temp: 36.7 C 36.7 C  SpO2: 93% 94%    Last Pain:  Vitals:   09/09/20 2155  TempSrc:   PainSc: 0-No pain                 Catalina Gravel

## 2020-09-09 NOTE — Progress Notes (Signed)
   ORTHOPAEDIC PROGRESS NOTE  s/p Procedure(s): OPEN REDUCTION INTERNAL FIXATION (ORIF) CLAVICULAR FRACTURE on 09/08/2020 with Dr. Griffin Basil  SUBJECTIVE: Reports some pain about the operative site. She is happy that she was able to sit up on the side of the bed. Having some soreness in her right hand and left hand. Feels a little anxious this morning.  No other complaints.  OBJECTIVE: PE: General: NAD, sitting up on side of hospital bed RUE: Dressing CDI and sling well fitting. Axillary nerve sensation preserved and symmetric.  Sensation intact in medial, radial, and ulnar distributions. Swelling and some tenderness over 4/5 metacarpals. Superficial abrasion PIP joint. Able to make a fist. Well perfused digits.    Vitals:   09/08/20 1345 09/08/20 2106  BP: (!) 141/88 136/78  Pulse: 67 98  Resp: 18 18  Temp: 98.8 F (37.1 C) 98.4 F (36.9 C)  SpO2: 95% 99%  Stable post-op imaging.    ASSESSMENT: Tammy Tucker is a 60 y.o. female   - POD#1 - right clavicle fracture - angulated right fifth metacarpal fracture    PLAN: Weightbearing: nonweightbearing in a sling Insicional and dressing care: Reinforce as needed Orthopedic device(s): Sling Showering: post-op day #2 with assistance VTE prophylaxis: per primary team, no orthopedic contraindications Pain control: PRN pain medications, preferring oral medications Follow - up plan: 1 week in office Contact information:  Dr. Ophelia Charter, Noemi Chapel, After hours and holidays please check [email or web address] for group call information for [facility] Dispo: TBD. PT/OT evals pending.  Xrays of right and left hand were obtained due to reported pain. Right hand had some swelling. Xrays of right hand show angulated, displaced 5th metacarpal fracture. Discussed this case with our colleagues on hand call. This will likely require operative fixation due to amount of displacement. They will see the patient today for further evaluation regarding her  hand.   I updated the patient via phone regarding xray results. Discussed she will likely require surgery. This could possibly be later this evening. Keep NPO for now. Patient very surprised by this as she is able to use right hand without significant pain.    Noemi Chapel, PA-C 09/09/2020
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
## 2020-09-09 NOTE — Anesthesia Procedure Notes (Signed)
Procedure Name: Intubation Date/Time: 09/09/2020 7:55 PM Performed by: Valetta Fuller, CRNA Pre-anesthesia Checklist: Patient identified, Emergency Drugs available, Suction available and Patient being monitored Patient Re-evaluated:Patient Re-evaluated prior to induction Oxygen Delivery Method: Circle system utilized Preoxygenation: Pre-oxygenation with 100% oxygen Induction Type: IV induction Laryngoscope Size: Miller and 2 Grade View: Grade I Tube type: Oral Tube size: 7.0 mm Number of attempts: 1 Airway Equipment and Method: Stylet Placement Confirmation: ETT inserted through vocal cords under direct vision,  positive ETCO2 and breath sounds checked- equal and bilateral Secured at: 22 cm Tube secured with: Tape Dental Injury: Teeth and Oropharynx as per pre-operative assessment
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
## 2020-09-09 NOTE — Progress Notes (Signed)
Patient seen and examined.   Displaced 5th mc fracture.   Recommended CRPP vs. ORIF.    The risks benefits and alternatives were discussed with the patient including but not limited to the risks of nonoperative treatment, versus surgical intervention including infection, bleeding, nerve injury, malunion, nonunion, the need for revision surgery, hardware prominence, hardware failure, the need for hardware removal, blood clots, cardiopulmonary complications, morbidity, mortality, among others, and they were willing to proceed.    Johnny Bridge, MD
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
## 2020-09-09 NOTE — Transfer of Care (Signed)
Immediate Anesthesia Transfer of Care Note  Patient: Tammy Tucker  Procedure(s) Performed: PERCUTANEOUS PINNING METACARPAL (Right )  Patient Location: PACU  Anesthesia Type:General  Level of Consciousness: awake, alert  and oriented  Airway & Oxygen Therapy: Patient Spontanous Breathing  Post-op Assessment: Report given to RN and Post -op Vital signs reviewed and stable  Post vital signs: Reviewed and stable  Last Vitals:  Vitals Value Taken Time  BP 176/74 09/09/20 2054  Temp    Pulse 100 09/09/20 2055  Resp 24 09/09/20 2055  SpO2 97 % 09/09/20 2055  Vitals shown include unvalidated device data.  Last Pain:  Vitals:   09/09/20 1539  TempSrc: Oral  PainSc:          Complications: No complications documented.
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
## 2020-09-10 ENCOUNTER — Encounter (HOSPITAL_COMMUNITY): Payer: Self-pay | Admitting: Orthopedic Surgery

## 2020-09-10 ENCOUNTER — Inpatient Hospital Stay (HOSPITAL_COMMUNITY): Payer: BC Managed Care – PPO

## 2020-09-10 LAB — BASIC METABOLIC PANEL
Anion gap: 9 (ref 5–15)
BUN: 8 mg/dL (ref 6–20)
CO2: 24 mmol/L (ref 22–32)
Calcium: 8.5 mg/dL — ABNORMAL LOW (ref 8.9–10.3)
Chloride: 105 mmol/L (ref 98–111)
Creatinine, Ser: 0.61 mg/dL (ref 0.44–1.00)
GFR, Estimated: >60 mL/min (ref >60)
Glucose, Bld: 184 mg/dL — ABNORMAL HIGH (ref 70–99)
Potassium: 3.3 mmol/L — ABNORMAL LOW (ref 3.5–5.1)
Sodium: 138 mmol/L (ref 135–145)

## 2020-09-10 LAB — MAGNESIUM: Magnesium: 1.8 mg/dL (ref 1.7–2.4)

## 2020-09-10 LAB — CBC
HCT: 33.3 % — ABNORMAL LOW (ref 36.0–46.0)
Hemoglobin: 11 g/dL — ABNORMAL LOW (ref 12.0–15.0)
MCH: 30.1 pg (ref 26.0–34.0)
MCHC: 33 g/dL (ref 30.0–36.0)
MCV: 91 fL (ref 80.0–100.0)
Platelets: 213 10*3/uL (ref 150–400)
RBC: 3.66 MIL/uL — ABNORMAL LOW (ref 3.87–5.11)
RDW: 13.5 % (ref 11.5–15.5)
WBC: 6.4 10*3/uL (ref 4.0–10.5)
nRBC: 0 % (ref 0.0–0.2)

## 2020-09-10 MED ORDER — METHOCARBAMOL 500 MG PO TABS: 1000.0000 mg | ORAL_TABLET | Freq: Three times a day (TID) | ORAL | 0 refills | Status: DC | PRN

## 2020-09-10 MED ORDER — POLYETHYLENE GLYCOL 3350 17 G PO PACK: 17.0000 g | PACK | Freq: Every day | ORAL | 0 refills | Status: DC | PRN

## 2020-09-10 MED ORDER — IBUPROFEN 200 MG PO TABS
600.0000 mg | ORAL_TABLET | Freq: Four times a day (QID) | ORAL | Status: DC | PRN
Start: 2020-09-10 — End: 2021-07-12

## 2020-09-10 MED ORDER — CEFAZOLIN SODIUM-DEXTROSE 2-4 GM/100ML-% IV SOLN
2.0000 g | Freq: Four times a day (QID) | INTRAVENOUS | Status: DC
  Administered 2020-09-10 (×2): 2 g via INTRAVENOUS
  Filled 2020-09-10 (×3): qty 100

## 2020-09-10 MED ORDER — OXYCODONE HCL 5 MG PO TABS: 5.0000 mg | ORAL_TABLET | Freq: Four times a day (QID) | ORAL | 0 refills | Status: DC | PRN

## 2020-09-10 MED ORDER — ACETAMINOPHEN 500 MG PO TABS: 1000.0000 mg | ORAL_TABLET | Freq: Four times a day (QID) | ORAL | 0 refills | Status: DC | PRN

## 2020-09-10 MED ORDER — POTASSIUM CHLORIDE 20 MEQ PO PACK
40.0000 meq | PACK | Freq: Once | ORAL | Status: AC
  Administered 2020-09-10: 40 meq via ORAL
  Filled 2020-09-10: qty 2
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
## 2020-09-14 DIAGNOSIS — S62306D Unspecified fracture of fifth metacarpal bone, right hand, subsequent encounter for fracture with routine healing: Secondary | ICD-10-CM | POA: Diagnosis not present

## 2020-09-14 DIAGNOSIS — M25561 Pain in right knee: Secondary | ICD-10-CM | POA: Diagnosis not present

## 2020-09-14 DIAGNOSIS — S42021D Displaced fracture of shaft of right clavicle, subsequent encounter for fracture with routine healing: Secondary | ICD-10-CM | POA: Diagnosis not present

## 2020-10-02 DIAGNOSIS — S2249XD Multiple fractures of ribs, unspecified side, subsequent encounter for fracture with routine healing: Secondary | ICD-10-CM | POA: Diagnosis not present

## 2020-10-02 DIAGNOSIS — S42001D Fracture of unspecified part of right clavicle, subsequent encounter for fracture with routine healing: Secondary | ICD-10-CM | POA: Diagnosis not present

## 2020-10-02 DIAGNOSIS — I10 Essential (primary) hypertension: Secondary | ICD-10-CM | POA: Diagnosis not present

## 2020-10-02 DIAGNOSIS — S62306D Unspecified fracture of fifth metacarpal bone, right hand, subsequent encounter for fracture with routine healing: Secondary | ICD-10-CM | POA: Diagnosis not present

## 2020-10-03 DIAGNOSIS — S62306D Unspecified fracture of fifth metacarpal bone, right hand, subsequent encounter for fracture with routine healing: Secondary | ICD-10-CM | POA: Diagnosis not present

## 2020-10-03 DIAGNOSIS — S42021D Displaced fracture of shaft of right clavicle, subsequent encounter for fracture with routine healing: Secondary | ICD-10-CM | POA: Diagnosis not present

## 2020-10-10 DIAGNOSIS — S42021D Displaced fracture of shaft of right clavicle, subsequent encounter for fracture with routine healing: Secondary | ICD-10-CM | POA: Diagnosis not present

## 2020-10-10 DIAGNOSIS — M79644 Pain in right finger(s): Secondary | ICD-10-CM | POA: Diagnosis not present

## 2020-10-17 DIAGNOSIS — S2249XD Multiple fractures of ribs, unspecified side, subsequent encounter for fracture with routine healing: Secondary | ICD-10-CM | POA: Diagnosis not present

## 2020-10-17 DIAGNOSIS — S62306D Unspecified fracture of fifth metacarpal bone, right hand, subsequent encounter for fracture with routine healing: Secondary | ICD-10-CM | POA: Diagnosis not present

## 2020-10-17 DIAGNOSIS — S42001D Fracture of unspecified part of right clavicle, subsequent encounter for fracture with routine healing: Secondary | ICD-10-CM | POA: Diagnosis not present

## 2020-10-17 DIAGNOSIS — I10 Essential (primary) hypertension: Secondary | ICD-10-CM | POA: Diagnosis not present

## 2020-10-25 DIAGNOSIS — S2249XD Multiple fractures of ribs, unspecified side, subsequent encounter for fracture with routine healing: Secondary | ICD-10-CM | POA: Diagnosis not present

## 2020-10-25 DIAGNOSIS — S42001D Fracture of unspecified part of right clavicle, subsequent encounter for fracture with routine healing: Secondary | ICD-10-CM | POA: Diagnosis not present

## 2020-10-25 DIAGNOSIS — S62306D Unspecified fracture of fifth metacarpal bone, right hand, subsequent encounter for fracture with routine healing: Secondary | ICD-10-CM | POA: Diagnosis not present

## 2020-10-25 DIAGNOSIS — I10 Essential (primary) hypertension: Secondary | ICD-10-CM | POA: Diagnosis not present

## 2020-10-29 ENCOUNTER — Other Ambulatory Visit: Payer: Self-pay

## 2020-10-29 ENCOUNTER — Ambulatory Visit (INDEPENDENT_AMBULATORY_CARE_PROVIDER_SITE_OTHER): Payer: BC Managed Care – PPO | Admitting: Family Medicine

## 2020-10-29 ENCOUNTER — Encounter: Payer: Self-pay | Admitting: Family Medicine

## 2020-10-29 VITALS — BP 120/74 | HR 73 | Temp 97.5°F | Resp 16 | Ht 59.0 in | Wt 205.4 lb

## 2020-10-29 DIAGNOSIS — F419 Anxiety disorder, unspecified: Secondary | ICD-10-CM

## 2020-10-29 DIAGNOSIS — R519 Headache, unspecified: Secondary | ICD-10-CM | POA: Diagnosis not present

## 2020-10-29 DIAGNOSIS — R0789 Other chest pain: Secondary | ICD-10-CM | POA: Diagnosis not present

## 2020-10-29 DIAGNOSIS — S62306D Unspecified fracture of fifth metacarpal bone, right hand, subsequent encounter for fracture with routine healing: Secondary | ICD-10-CM

## 2020-10-29 DIAGNOSIS — E876 Hypokalemia: Secondary | ICD-10-CM

## 2020-10-29 MED ORDER — ALPRAZOLAM 0.25 MG PO TABS: 0.2500 mg | ORAL_TABLET | Freq: Every day | ORAL | 0 refills | Status: DC | PRN
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
## 2020-10-31 DIAGNOSIS — I10 Essential (primary) hypertension: Secondary | ICD-10-CM | POA: Diagnosis not present

## 2020-10-31 DIAGNOSIS — S42001D Fracture of unspecified part of right clavicle, subsequent encounter for fracture with routine healing: Secondary | ICD-10-CM | POA: Diagnosis not present

## 2020-10-31 DIAGNOSIS — S62306D Unspecified fracture of fifth metacarpal bone, right hand, subsequent encounter for fracture with routine healing: Secondary | ICD-10-CM | POA: Diagnosis not present

## 2020-10-31 DIAGNOSIS — S2249XD Multiple fractures of ribs, unspecified side, subsequent encounter for fracture with routine healing: Secondary | ICD-10-CM | POA: Diagnosis not present

## 2020-11-01 DIAGNOSIS — S42001D Fracture of unspecified part of right clavicle, subsequent encounter for fracture with routine healing: Secondary | ICD-10-CM | POA: Diagnosis not present

## 2020-11-01 DIAGNOSIS — S62306D Unspecified fracture of fifth metacarpal bone, right hand, subsequent encounter for fracture with routine healing: Secondary | ICD-10-CM | POA: Diagnosis not present

## 2020-11-01 DIAGNOSIS — I10 Essential (primary) hypertension: Secondary | ICD-10-CM | POA: Diagnosis not present

## 2020-11-01 DIAGNOSIS — S2249XD Multiple fractures of ribs, unspecified side, subsequent encounter for fracture with routine healing: Secondary | ICD-10-CM | POA: Diagnosis not present

## 2020-11-02 DIAGNOSIS — I10 Essential (primary) hypertension: Secondary | ICD-10-CM | POA: Diagnosis not present

## 2020-11-02 DIAGNOSIS — S42001D Fracture of unspecified part of right clavicle, subsequent encounter for fracture with routine healing: Secondary | ICD-10-CM | POA: Diagnosis not present

## 2020-11-02 DIAGNOSIS — S62306D Unspecified fracture of fifth metacarpal bone, right hand, subsequent encounter for fracture with routine healing: Secondary | ICD-10-CM | POA: Diagnosis not present

## 2020-11-02 DIAGNOSIS — S2249XD Multiple fractures of ribs, unspecified side, subsequent encounter for fracture with routine healing: Secondary | ICD-10-CM | POA: Diagnosis not present

## 2020-11-05 DIAGNOSIS — S62306D Unspecified fracture of fifth metacarpal bone, right hand, subsequent encounter for fracture with routine healing: Secondary | ICD-10-CM | POA: Diagnosis not present

## 2020-11-05 DIAGNOSIS — I10 Essential (primary) hypertension: Secondary | ICD-10-CM | POA: Diagnosis not present

## 2020-11-05 DIAGNOSIS — S42001D Fracture of unspecified part of right clavicle, subsequent encounter for fracture with routine healing: Secondary | ICD-10-CM | POA: Diagnosis not present

## 2020-11-05 DIAGNOSIS — S2249XD Multiple fractures of ribs, unspecified side, subsequent encounter for fracture with routine healing: Secondary | ICD-10-CM | POA: Diagnosis not present

## 2020-11-06 ENCOUNTER — Telehealth: Payer: Self-pay | Admitting: Family Medicine

## 2020-11-06 ENCOUNTER — Encounter: Payer: Self-pay | Admitting: Family Medicine

## 2020-11-06 NOTE — Telephone Encounter (Signed)
Letter created and mailed to patient as requested.
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
## 2020-11-07 DIAGNOSIS — S62306D Unspecified fracture of fifth metacarpal bone, right hand, subsequent encounter for fracture with routine healing: Secondary | ICD-10-CM | POA: Diagnosis not present

## 2020-11-07 DIAGNOSIS — S42021D Displaced fracture of shaft of right clavicle, subsequent encounter for fracture with routine healing: Secondary | ICD-10-CM | POA: Diagnosis not present

## 2020-11-07 DIAGNOSIS — I10 Essential (primary) hypertension: Secondary | ICD-10-CM | POA: Diagnosis not present

## 2020-11-07 DIAGNOSIS — S2249XD Multiple fractures of ribs, unspecified side, subsequent encounter for fracture with routine healing: Secondary | ICD-10-CM | POA: Diagnosis not present

## 2020-11-07 DIAGNOSIS — S42001D Fracture of unspecified part of right clavicle, subsequent encounter for fracture with routine healing: Secondary | ICD-10-CM | POA: Diagnosis not present

## 2020-11-08 DIAGNOSIS — S62306D Unspecified fracture of fifth metacarpal bone, right hand, subsequent encounter for fracture with routine healing: Secondary | ICD-10-CM | POA: Diagnosis not present

## 2020-11-08 DIAGNOSIS — S42001D Fracture of unspecified part of right clavicle, subsequent encounter for fracture with routine healing: Secondary | ICD-10-CM | POA: Diagnosis not present

## 2020-11-08 DIAGNOSIS — I10 Essential (primary) hypertension: Secondary | ICD-10-CM | POA: Diagnosis not present

## 2020-11-08 DIAGNOSIS — S2249XD Multiple fractures of ribs, unspecified side, subsequent encounter for fracture with routine healing: Secondary | ICD-10-CM | POA: Diagnosis not present

## 2020-11-14 DIAGNOSIS — S2249XD Multiple fractures of ribs, unspecified side, subsequent encounter for fracture with routine healing: Secondary | ICD-10-CM | POA: Diagnosis not present

## 2020-11-14 DIAGNOSIS — I10 Essential (primary) hypertension: Secondary | ICD-10-CM | POA: Diagnosis not present

## 2020-11-14 DIAGNOSIS — S42001D Fracture of unspecified part of right clavicle, subsequent encounter for fracture with routine healing: Secondary | ICD-10-CM | POA: Diagnosis not present

## 2020-11-14 DIAGNOSIS — S62306D Unspecified fracture of fifth metacarpal bone, right hand, subsequent encounter for fracture with routine healing: Secondary | ICD-10-CM | POA: Diagnosis not present

## 2020-11-15 DIAGNOSIS — S42001D Fracture of unspecified part of right clavicle, subsequent encounter for fracture with routine healing: Secondary | ICD-10-CM | POA: Diagnosis not present

## 2020-11-15 DIAGNOSIS — I10 Essential (primary) hypertension: Secondary | ICD-10-CM | POA: Diagnosis not present

## 2020-11-15 DIAGNOSIS — S2249XD Multiple fractures of ribs, unspecified side, subsequent encounter for fracture with routine healing: Secondary | ICD-10-CM | POA: Diagnosis not present

## 2020-11-15 DIAGNOSIS — S62306D Unspecified fracture of fifth metacarpal bone, right hand, subsequent encounter for fracture with routine healing: Secondary | ICD-10-CM | POA: Diagnosis not present

## 2020-11-16 DIAGNOSIS — S2249XD Multiple fractures of ribs, unspecified side, subsequent encounter for fracture with routine healing: Secondary | ICD-10-CM | POA: Diagnosis not present

## 2020-11-16 DIAGNOSIS — S62306D Unspecified fracture of fifth metacarpal bone, right hand, subsequent encounter for fracture with routine healing: Secondary | ICD-10-CM | POA: Diagnosis not present

## 2020-11-16 DIAGNOSIS — I10 Essential (primary) hypertension: Secondary | ICD-10-CM | POA: Diagnosis not present

## 2020-11-16 DIAGNOSIS — S42001D Fracture of unspecified part of right clavicle, subsequent encounter for fracture with routine healing: Secondary | ICD-10-CM | POA: Diagnosis not present

## 2020-11-19 DIAGNOSIS — S42001D Fracture of unspecified part of right clavicle, subsequent encounter for fracture with routine healing: Secondary | ICD-10-CM | POA: Diagnosis not present

## 2020-11-19 DIAGNOSIS — I10 Essential (primary) hypertension: Secondary | ICD-10-CM | POA: Diagnosis not present

## 2020-11-19 DIAGNOSIS — S2249XD Multiple fractures of ribs, unspecified side, subsequent encounter for fracture with routine healing: Secondary | ICD-10-CM | POA: Diagnosis not present

## 2020-11-19 DIAGNOSIS — S62306D Unspecified fracture of fifth metacarpal bone, right hand, subsequent encounter for fracture with routine healing: Secondary | ICD-10-CM | POA: Diagnosis not present

## 2020-11-21 DIAGNOSIS — S62306D Unspecified fracture of fifth metacarpal bone, right hand, subsequent encounter for fracture with routine healing: Secondary | ICD-10-CM | POA: Diagnosis not present

## 2020-11-21 DIAGNOSIS — S42001D Fracture of unspecified part of right clavicle, subsequent encounter for fracture with routine healing: Secondary | ICD-10-CM | POA: Diagnosis not present

## 2020-11-21 DIAGNOSIS — I10 Essential (primary) hypertension: Secondary | ICD-10-CM | POA: Diagnosis not present

## 2020-11-21 DIAGNOSIS — S2249XD Multiple fractures of ribs, unspecified side, subsequent encounter for fracture with routine healing: Secondary | ICD-10-CM | POA: Diagnosis not present

## 2020-11-28 ENCOUNTER — Other Ambulatory Visit: Payer: Self-pay | Admitting: Family Medicine

## 2020-11-28 DIAGNOSIS — F419 Anxiety disorder, unspecified: Secondary | ICD-10-CM

## 2020-11-28 NOTE — Telephone Encounter (Signed)
Patient is calling and requesting a refill for ALPRAZolam Duanne Moron) 0.25 MG tablet to be sent to   [id number] - [place], [address], [place] [address]  Phone:  [phone] Fax:  [phone]  CB is [phone]

## 2020-11-30 DIAGNOSIS — I10 Essential (primary) hypertension: Secondary | ICD-10-CM | POA: Diagnosis not present

## 2020-11-30 DIAGNOSIS — S42001D Fracture of unspecified part of right clavicle, subsequent encounter for fracture with routine healing: Secondary | ICD-10-CM | POA: Diagnosis not present

## 2020-11-30 DIAGNOSIS — S62306D Unspecified fracture of fifth metacarpal bone, right hand, subsequent encounter for fracture with routine healing: Secondary | ICD-10-CM | POA: Diagnosis not present

## 2020-11-30 DIAGNOSIS — S2249XD Multiple fractures of ribs, unspecified side, subsequent encounter for fracture with routine healing: Secondary | ICD-10-CM | POA: Diagnosis not present

## 2020-11-30 MED ORDER — ALPRAZOLAM 0.25 MG PO TABS: 0.1250 mg | ORAL_TABLET | Freq: Every day | ORAL | 0 refills | Status: AC | PRN

## 2020-12-05 DIAGNOSIS — S42021D Displaced fracture of shaft of right clavicle, subsequent encounter for fracture with routine healing: Secondary | ICD-10-CM | POA: Diagnosis not present

## 2020-12-05 DIAGNOSIS — S62306D Unspecified fracture of fifth metacarpal bone, right hand, subsequent encounter for fracture with routine healing: Secondary | ICD-10-CM | POA: Diagnosis not present

## 2020-12-07 DIAGNOSIS — S2249XD Multiple fractures of ribs, unspecified side, subsequent encounter for fracture with routine healing: Secondary | ICD-10-CM | POA: Diagnosis not present

## 2020-12-07 DIAGNOSIS — I10 Essential (primary) hypertension: Secondary | ICD-10-CM | POA: Diagnosis not present

## 2020-12-07 DIAGNOSIS — S42001D Fracture of unspecified part of right clavicle, subsequent encounter for fracture with routine healing: Secondary | ICD-10-CM | POA: Diagnosis not present

## 2020-12-07 DIAGNOSIS — S62306D Unspecified fracture of fifth metacarpal bone, right hand, subsequent encounter for fracture with routine healing: Secondary | ICD-10-CM | POA: Diagnosis not present

## 2020-12-10 DIAGNOSIS — I10 Essential (primary) hypertension: Secondary | ICD-10-CM | POA: Diagnosis not present

## 2020-12-10 DIAGNOSIS — S2249XD Multiple fractures of ribs, unspecified side, subsequent encounter for fracture with routine healing: Secondary | ICD-10-CM | POA: Diagnosis not present

## 2020-12-10 DIAGNOSIS — S62306D Unspecified fracture of fifth metacarpal bone, right hand, subsequent encounter for fracture with routine healing: Secondary | ICD-10-CM | POA: Diagnosis not present

## 2020-12-10 DIAGNOSIS — S42001D Fracture of unspecified part of right clavicle, subsequent encounter for fracture with routine healing: Secondary | ICD-10-CM | POA: Diagnosis not present

## 2020-12-12 DIAGNOSIS — S42001D Fracture of unspecified part of right clavicle, subsequent encounter for fracture with routine healing: Secondary | ICD-10-CM | POA: Diagnosis not present

## 2020-12-12 DIAGNOSIS — I10 Essential (primary) hypertension: Secondary | ICD-10-CM | POA: Diagnosis not present

## 2020-12-12 DIAGNOSIS — S62306D Unspecified fracture of fifth metacarpal bone, right hand, subsequent encounter for fracture with routine healing: Secondary | ICD-10-CM | POA: Diagnosis not present

## 2020-12-12 DIAGNOSIS — S2249XD Multiple fractures of ribs, unspecified side, subsequent encounter for fracture with routine healing: Secondary | ICD-10-CM | POA: Diagnosis not present

## 2020-12-17 DIAGNOSIS — I10 Essential (primary) hypertension: Secondary | ICD-10-CM | POA: Diagnosis not present

## 2020-12-17 DIAGNOSIS — S2249XD Multiple fractures of ribs, unspecified side, subsequent encounter for fracture with routine healing: Secondary | ICD-10-CM | POA: Diagnosis not present

## 2020-12-17 DIAGNOSIS — S62306D Unspecified fracture of fifth metacarpal bone, right hand, subsequent encounter for fracture with routine healing: Secondary | ICD-10-CM | POA: Diagnosis not present

## 2020-12-17 DIAGNOSIS — S42001D Fracture of unspecified part of right clavicle, subsequent encounter for fracture with routine healing: Secondary | ICD-10-CM | POA: Diagnosis not present

## 2020-12-19 DIAGNOSIS — S42001D Fracture of unspecified part of right clavicle, subsequent encounter for fracture with routine healing: Secondary | ICD-10-CM | POA: Diagnosis not present

## 2020-12-19 DIAGNOSIS — I10 Essential (primary) hypertension: Secondary | ICD-10-CM | POA: Diagnosis not present

## 2020-12-19 DIAGNOSIS — S2249XD Multiple fractures of ribs, unspecified side, subsequent encounter for fracture with routine healing: Secondary | ICD-10-CM | POA: Diagnosis not present

## 2020-12-19 DIAGNOSIS — S62306D Unspecified fracture of fifth metacarpal bone, right hand, subsequent encounter for fracture with routine healing: Secondary | ICD-10-CM | POA: Diagnosis not present

## 2020-12-25 DIAGNOSIS — S62306D Unspecified fracture of fifth metacarpal bone, right hand, subsequent encounter for fracture with routine healing: Secondary | ICD-10-CM | POA: Diagnosis not present

## 2020-12-25 DIAGNOSIS — I10 Essential (primary) hypertension: Secondary | ICD-10-CM | POA: Diagnosis not present

## 2020-12-25 DIAGNOSIS — S42001D Fracture of unspecified part of right clavicle, subsequent encounter for fracture with routine healing: Secondary | ICD-10-CM | POA: Diagnosis not present

## 2020-12-25 DIAGNOSIS — S2249XD Multiple fractures of ribs, unspecified side, subsequent encounter for fracture with routine healing: Secondary | ICD-10-CM | POA: Diagnosis not present

## 2021-01-01 ENCOUNTER — Ambulatory Visit (INDEPENDENT_AMBULATORY_CARE_PROVIDER_SITE_OTHER): Payer: BC Managed Care – PPO | Admitting: Family Medicine

## 2021-01-01 ENCOUNTER — Other Ambulatory Visit: Payer: Self-pay

## 2021-01-01 ENCOUNTER — Encounter: Payer: Self-pay | Admitting: Family Medicine

## 2021-01-01 VITALS — BP 130/80 | HR 85 | Resp 16 | Ht 59.0 in | Wt 213.0 lb

## 2021-01-01 DIAGNOSIS — M7918 Myalgia, other site: Secondary | ICD-10-CM

## 2021-01-01 DIAGNOSIS — R7303 Prediabetes: Secondary | ICD-10-CM | POA: Diagnosis not present

## 2021-01-01 DIAGNOSIS — M549 Dorsalgia, unspecified: Secondary | ICD-10-CM

## 2021-01-01 DIAGNOSIS — F419 Anxiety disorder, unspecified: Secondary | ICD-10-CM

## 2021-01-01 DIAGNOSIS — M25511 Pain in right shoulder: Secondary | ICD-10-CM | POA: Diagnosis not present

## 2021-01-01 LAB — POCT GLYCOSYLATED HEMOGLOBIN (HGB A1C): HbA1c, POC (prediabetic range): 5.8 % (ref 5.7–6.4)

## 2021-01-01 MED ORDER — TRAZODONE HCL 50 MG PO TABS: 25.0000 mg | ORAL_TABLET | Freq: Every day | ORAL | 2 refills | Status: DC

## 2021-01-01 NOTE — Patient Instructions (Addendum)
A few things to remember from today's visit:   Right shoulder pain, unspecified chronicity  Anxiety disorder, unspecified type  Musculoskeletal pain  Prediabetes - Plan: POC HgB A1c  Bilateral back pain, unspecified back location, unspecified chronicity  If you need refills please call your pharmacy. Do not use My Chart to request refills or for acute issues that need immediate attention.   I do not see swelling today. Musculoskeletal pain can last months and may not go away completely. Continue following with surgeon and PT. Today Trazodone 25 mg to take at bedtime to help you sleep.  Caution with carbs, weigh loss may also help with pain.   Please be sure medication list is accurate. If a new problem present, please set up appointment sooner than planned today.
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
## 2021-01-16 DIAGNOSIS — S62306D Unspecified fracture of fifth metacarpal bone, right hand, subsequent encounter for fracture with routine healing: Secondary | ICD-10-CM | POA: Diagnosis not present

## 2021-01-16 DIAGNOSIS — S42021D Displaced fracture of shaft of right clavicle, subsequent encounter for fracture with routine healing: Secondary | ICD-10-CM | POA: Diagnosis not present

## 2021-01-31 DIAGNOSIS — M25511 Pain in right shoulder: Secondary | ICD-10-CM | POA: Diagnosis not present

## 2021-01-31 DIAGNOSIS — M25641 Stiffness of right hand, not elsewhere classified: Secondary | ICD-10-CM | POA: Diagnosis not present

## 2021-01-31 DIAGNOSIS — M6281 Muscle weakness (generalized): Secondary | ICD-10-CM | POA: Diagnosis not present

## 2021-01-31 DIAGNOSIS — M25611 Stiffness of right shoulder, not elsewhere classified: Secondary | ICD-10-CM | POA: Diagnosis not present

## 2021-03-13 ENCOUNTER — Ambulatory Visit: Payer: BC Managed Care – PPO | Admitting: Family Medicine
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
## 2021-07-12 ENCOUNTER — Other Ambulatory Visit: Payer: Self-pay

## 2021-07-12 ENCOUNTER — Ambulatory Visit (INDEPENDENT_AMBULATORY_CARE_PROVIDER_SITE_OTHER): Payer: Managed Care, Other (non HMO)

## 2021-07-12 ENCOUNTER — Ambulatory Visit (INDEPENDENT_AMBULATORY_CARE_PROVIDER_SITE_OTHER): Payer: Managed Care, Other (non HMO) | Admitting: Family Medicine

## 2021-07-12 ENCOUNTER — Encounter: Payer: Self-pay | Admitting: Family Medicine

## 2021-07-12 VITALS — BP 140/80 | HR 88 | Resp 16 | Ht 59.0 in | Wt 210.0 lb

## 2021-07-12 DIAGNOSIS — G4700 Insomnia, unspecified: Secondary | ICD-10-CM

## 2021-07-12 DIAGNOSIS — F419 Anxiety disorder, unspecified: Secondary | ICD-10-CM

## 2021-07-12 DIAGNOSIS — M25562 Pain in left knee: Secondary | ICD-10-CM

## 2021-07-12 DIAGNOSIS — E876 Hypokalemia: Secondary | ICD-10-CM

## 2021-07-12 MED ORDER — CELECOXIB 100 MG PO CAPS: 100.0000 mg | ORAL_CAPSULE | Freq: Two times a day (BID) | ORAL | 0 refills | Status: AC

## 2021-07-12 NOTE — Patient Instructions (Addendum)
.  A few things to remember from today's visit:  Left knee pain, unspecified chronicity - Plan: DG Knee Complete 4 Views Left, Ambulatory referral to Sports Medicine  Hypokalemia  Insomnia, unspecified type  Over the counter melatonin 5-10 mg 2 hours before bedtime with empty stomach. Let me know if you want to try a different medication for anxiety. Fall precautions. Celebrex 100 mg 2 times daily for pain. Continue Tylenol 500 mg 3-4 times per day. Stop Ibuprofen.  For wt loss 1700 kcal/day and walking in place for 10-15 min as tolerated.'  If you need refills please call your pharmacy. Do not use My Chart to request refills or for acute issues that need immediate attention.   Please be sure medication list is accurate. If a new problem present, please set up appointment sooner than planned today.

## 2021-07-17 ENCOUNTER — Ambulatory Visit: Payer: Self-pay | Admitting: Family Medicine

## 2021-08-05 ENCOUNTER — Other Ambulatory Visit: Payer: Self-pay

## 2021-08-05 ENCOUNTER — Ambulatory Visit
Admission: EM | Admit: 2021-08-05 | Discharge: 2021-08-05 | Disposition: A | Discharge status: Home / Self Care | Payer: Managed Care, Other (non HMO)

## 2021-08-05 ENCOUNTER — Encounter: Payer: Self-pay | Admitting: Emergency Medicine

## 2021-08-05 DIAGNOSIS — R030 Elevated blood-pressure reading, without diagnosis of hypertension: Secondary | ICD-10-CM

## 2021-08-05 DIAGNOSIS — J069 Acute upper respiratory infection, unspecified: Secondary | ICD-10-CM

## 2021-08-05 MED ORDER — GUAIFENESIN 200 MG PO TABS: 200.0000 mg | ORAL_TABLET | ORAL | 0 refills | Status: DC | PRN

## 2021-08-05 NOTE — Discharge Instructions (Signed)
It appears that you have a viral upper respiratory infection that should resolve on its own in the next few days.  You have been prescribed a cough medication to take as needed and to loosen phlegm.  COVID-19 and flu test is pending.  We will call if it is positive.  Please monitor blood pressure at home.
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
## 2021-08-05 NOTE — Progress Notes (Deleted)
° ° °  HPI:  [name] is a 61 y.o. female, who is here today to follow on recent visit.  Review of Systems Rest see pertinent positives and negatives per HPI.  Current Outpatient Medications on File Prior to Visit  Medication Sig Dispense Refill   acetaminophen (TYLENOL) 500 MG tablet Take 2 tablets (1,000 mg total) by mouth every 6 (six) hours as needed for mild pain. 30 tablet 0   atorvastatin (LIPITOR) 20 MG tablet Take 1 tablet (20 mg total) by mouth daily. 90 tablet 1   guaiFENesin 200 MG tablet Take 1 tablet (200 mg total) by mouth every 4 (four) hours as needed for cough or to loosen phlegm. 30 suppository 0   latanoprost (XALATAN) 0.005 % ophthalmic solution Place 1 drop into both eyes at bedtime.     methocarbamol (ROBAXIN) 500 MG tablet Take 2 tablets (1,000 mg total) by mouth every 8 (eight) hours as needed for muscle spasms. 40 tablet 0   Multiple Vitamin (MULTIVITAMIN) tablet Take 1 tablet by mouth daily.     polyethylene glycol (MIRALAX) 17 g packet Take 17 g by mouth daily as needed for mild constipation. 14 each 0   Vitamin D, Ergocalciferol, (DRISDOL) 1.25 MG (50000 UNIT) CAPS capsule TAKE 1 CAPSULE BY MOUTH EVERY 7 DAYS FOR 8 DOSES. 12 capsule 1   No current facility-administered medications on file prior to visit.    Past Medical History:  Diagnosis Date   Fibroids    Glaucoma    Obesity    Retinal pigmentation    Vitamin D deficiency    Allergies  Allergen Reactions   Codeine Itching    Blisters around mouth   Tylenol With Codeine #3 [Acetaminophen-Codeine] Itching    Social History   Socioeconomic History   Marital status: Married    Spouse name: Not on file   Number of children: 3   Years of education: Not on file   Highest education level: Not on file  Occupational History   Not on file  Tobacco Use   Smoking status: Never   Smokeless tobacco: Never  Vaping Use   Vaping Use: Not on file  Substance and Sexual Activity   Alcohol use: Never    Drug use: Never   Sexual activity: Yes    Partners: Male  Other Topics Concern   Not on file  Social History Narrative   ** Merged History Encounter **       Social Determinants of Health   Financial Resource Strain: Not on file  Food Insecurity: Not on file  Transportation Needs: Not on file  Physical Activity: Not on file  Stress: Not on file  Social Connections: Not on file    There were no vitals filed for this visit. There is no height or weight on file to calculate BMI.  Physical Exam  ASSESSMENT AND PLAN:   There are no diagnoses linked to this encounter.  No orders of the defined types were placed in this encounter.   No problem-specific Assessment & Plan notes found for this encounter.   No follow-ups on file.   Betty G. Martinique, MD  [facility]. [place] office.

## 2021-08-05 NOTE — ED Triage Notes (Signed)
Patient c/o chest congestion, scratchy throat, cough x 2 days.  Patient having some SOB.  Patient has taken some Vicks Cold & Flu, Advil, Honey Cough Drops.

## 2021-08-06 LAB — COVID-19, FLU A+B NAA
Influenza A, NAA: NOT DETECTED
Influenza B, NAA: NOT DETECTED
SARS-CoV-2, NAA: NOT DETECTED

## 2021-08-09 ENCOUNTER — Ambulatory Visit: Payer: Managed Care, Other (non HMO) | Admitting: Family Medicine

## 2021-08-23 ENCOUNTER — Ambulatory Visit: Payer: Managed Care, Other (non HMO) | Admitting: Family Medicine
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
## 2021-08-27 ENCOUNTER — Ambulatory Visit: Payer: Self-pay

## 2021-08-27 ENCOUNTER — Ambulatory Visit (INDEPENDENT_AMBULATORY_CARE_PROVIDER_SITE_OTHER): Payer: Managed Care, Other (non HMO) | Admitting: Family Medicine

## 2021-08-27 ENCOUNTER — Ambulatory Visit (INDEPENDENT_AMBULATORY_CARE_PROVIDER_SITE_OTHER): Payer: Managed Care, Other (non HMO)

## 2021-08-27 ENCOUNTER — Other Ambulatory Visit: Payer: Self-pay

## 2021-08-27 VITALS — BP 176/108 | HR 93 | Ht 59.0 in | Wt 211.8 lb

## 2021-08-27 DIAGNOSIS — M25562 Pain in left knee: Secondary | ICD-10-CM

## 2021-08-27 DIAGNOSIS — M79641 Pain in right hand: Secondary | ICD-10-CM

## 2021-08-27 DIAGNOSIS — H547 Unspecified visual loss: Secondary | ICD-10-CM

## 2021-08-27 DIAGNOSIS — M25511 Pain in right shoulder: Secondary | ICD-10-CM | POA: Diagnosis not present

## 2021-08-27 DIAGNOSIS — G8929 Other chronic pain: Secondary | ICD-10-CM

## 2021-08-27 NOTE — Patient Instructions (Addendum)
Thank you for coming in today.   I've placed an order for home health physical therapy. You should hear about scheduling within 1 week.  Please get Xrays today before you leave   Recheck back in 6 weeks

## 2021-08-28 ENCOUNTER — Encounter: Payer: Self-pay | Admitting: Family Medicine

## 2021-08-29 ENCOUNTER — Ambulatory Visit: Payer: Managed Care, Other (non HMO) | Admitting: Family Medicine

## 2021-08-29 NOTE — Progress Notes (Signed)
Right hand x-ray shows some arthritis changes and evidence of prior fracture in the hand.

## 2021-08-29 NOTE — Progress Notes (Signed)
Right clavicle x-ray shows evidence of prior surgery but otherwise looks okay.

## 2021-08-29 NOTE — Progress Notes (Signed)
Right shoulder x-ray again shows a healed clavicle fracture but otherwise looks okay to radiology.

## 2021-09-06 ENCOUNTER — Ambulatory Visit: Payer: Managed Care, Other (non HMO) | Admitting: Family Medicine

## 2021-09-13 NOTE — Telephone Encounter (Signed)
Patient called to let Dr Georgina Snell know that she is still having a lot of back pain. She also did not know if a form was sent to [place] to complete or not. ? ?

## 2021-10-08 ENCOUNTER — Ambulatory Visit: Payer: Managed Care, Other (non HMO) | Admitting: Family Medicine

## 2021-10-14 ENCOUNTER — Ambulatory Visit: Payer: Managed Care, Other (non HMO) | Admitting: Family Medicine

## 2021-10-22 ENCOUNTER — Ambulatory Visit: Payer: Managed Care, Other (non HMO) | Admitting: Family Medicine

## 2021-11-05 ENCOUNTER — Ambulatory Visit: Payer: Managed Care, Other (non HMO) | Admitting: Family Medicine
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
## 2021-11-07 ENCOUNTER — Ambulatory Visit: Payer: Self-pay | Admitting: Family Medicine
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
## 2021-11-21 ENCOUNTER — Ambulatory Visit: Payer: Self-pay | Admitting: Family Medicine

## 2021-11-26 ENCOUNTER — Ambulatory Visit: Payer: Managed Care, Other (non HMO) | Admitting: Family Medicine

## 2021-11-28 ENCOUNTER — Ambulatory Visit: Payer: Self-pay

## 2021-11-28 ENCOUNTER — Ambulatory Visit: Payer: Commercial Managed Care - HMO | Admitting: Family Medicine

## 2021-11-28 VITALS — BP 148/92 | HR 86 | Ht 59.0 in | Wt 214.4 lb

## 2021-11-28 DIAGNOSIS — M545 Low back pain, unspecified: Secondary | ICD-10-CM

## 2021-11-28 DIAGNOSIS — G8929 Other chronic pain: Secondary | ICD-10-CM

## 2021-11-28 DIAGNOSIS — M25511 Pain in right shoulder: Secondary | ICD-10-CM | POA: Diagnosis not present

## 2021-11-28 DIAGNOSIS — M25562 Pain in left knee: Secondary | ICD-10-CM | POA: Diagnosis not present

## 2021-11-28 MED ORDER — AMBULATORY NON FORMULARY MEDICATION: 0 refills | Status: AC
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
## 2021-11-28 NOTE — Patient Instructions (Addendum)
Thank you for coming in today.   Please use Voltaren gel (Generic Diclofenac Gel) up to 4x daily for pain as needed.  This is available over-the-counter as both the name brand Voltaren gel and the generic diclofenac gel.   Here is a prescription for you to get a cane.  You received an injection today. Seek immediate medical attention if the joint becomes red, extremely painful, or is oozing fluid.   I've referred you to Home Health Physical Therapy.  Let us know if you don't hear from them in one week.   Check back in 6 weeks
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
## 2021-12-10 ENCOUNTER — Encounter: Payer: Self-pay | Admitting: Family Medicine

## 2021-12-10 ENCOUNTER — Ambulatory Visit (INDEPENDENT_AMBULATORY_CARE_PROVIDER_SITE_OTHER): Payer: Commercial Managed Care - HMO | Admitting: Family Medicine

## 2021-12-10 VITALS — BP 132/80 | HR 90 | Temp 99.0°F | Resp 16 | Ht 59.0 in | Wt 213.1 lb

## 2021-12-10 DIAGNOSIS — I700 Atherosclerosis of aorta: Secondary | ICD-10-CM | POA: Insufficient documentation

## 2021-12-10 DIAGNOSIS — M25471 Effusion, right ankle: Secondary | ICD-10-CM

## 2021-12-10 DIAGNOSIS — M545 Low back pain, unspecified: Secondary | ICD-10-CM | POA: Diagnosis not present

## 2021-12-10 DIAGNOSIS — M25472 Effusion, left ankle: Secondary | ICD-10-CM

## 2021-12-10 DIAGNOSIS — E785 Hyperlipidemia, unspecified: Secondary | ICD-10-CM

## 2021-12-10 DIAGNOSIS — G8929 Other chronic pain: Secondary | ICD-10-CM | POA: Insufficient documentation

## 2021-12-10 DIAGNOSIS — E876 Hypokalemia: Secondary | ICD-10-CM

## 2021-12-10 DIAGNOSIS — R7303 Prediabetes: Secondary | ICD-10-CM | POA: Diagnosis not present

## 2021-12-10 LAB — LIPID PANEL
Cholesterol: 290 mg/dL — ABNORMAL HIGH (ref 0–200)
HDL: 72.2 mg/dL (ref >39.00)
LDL Cholesterol: 197 mg/dL — ABNORMAL HIGH (ref 0–99)
NonHDL: 217.58
Total CHOL/HDL Ratio: 4
Triglycerides: 102 mg/dL (ref 0.0–149.0)
VLDL: 20.4 mg/dL (ref 0.0–40.0)

## 2021-12-10 LAB — BASIC METABOLIC PANEL
BUN: 10 mg/dL (ref 6–23)
CO2: 30 mEq/L (ref 19–32)
Calcium: 9.7 mg/dL (ref 8.4–10.5)
Chloride: 103 mEq/L (ref 96–112)
Creatinine, Ser: 0.62 mg/dL (ref 0.40–1.20)
GFR: 96.32 mL/min (ref >60.00)
Glucose, Bld: 83 mg/dL (ref 70–99)
Potassium: 3.3 mEq/L — ABNORMAL LOW (ref 3.5–5.1)
Sodium: 141 mEq/L (ref 135–145)

## 2021-12-10 LAB — HEMOGLOBIN A1C: Hgb A1c MFr Bld: 6.1 % (ref 4.6–6.5)

## 2021-12-10 NOTE — Assessment & Plan Note (Signed)
Imaging findings discussed as well as CV benefits of statins.

## 2021-12-10 NOTE — Assessment & Plan Note (Signed)
Continue low fat diet. Further recommendations according to lipid panel result.

## 2021-12-10 NOTE — Patient Instructions (Addendum)
A few things to remember from today's visit:   Chronic bilateral low back pain without sciatica  Ankle edema, bilateral - Plan: Basic metabolic panel  Prediabetes - Plan: Hemoglobin A1c  Aortic atherosclerosis (HCC) - Plan: Lipid panel  Hyperlipidemia, unspecified hyperlipidemia type - Plan: Lipid panel  If you need refills please call your pharmacy. Do not use My Chart to request refills or for acute issues that need immediate attention.   Ankle swelling does not seem to be worrisome. Compression stocking will help.  Please be sure medication list is accurate. If a new problem present, please set up appointment sooner than planned today.   Atherosclerosis  Atherosclerosis is when plaque builds up in the arteries. This causes narrowing and hardening of the arteries. Arteries are blood vessels that carry blood from the heart to all parts of the body. This blood contains oxygen. Plaque occurs due to inflammation or from a buildup of fat, cholesterol, calcium, waste products of cells, and a clotting material in the blood (fibrin). Plaque decreases the amount of blood that can flow through the artery. Atherosclerosis can affect any artery in your body, including: Heart arteries. Damage to these arteries may lead to coronary artery disease, which can cause a heart attack. Brain arteries. Damage to these arteries may cause a stroke. Leg, arm, and pelvis arteries. Peripheral artery disease (PAD) may result from damage to these arteries. Kidney arteries. Kidney (renal) failure may result from damage to kidney arteries. Treatment may slow the disease and prevent further damage to your heart, brain, peripheral arteries, and kidneys. What are the causes? This condition develops slowly over many years. The inner layers of your arteries become damaged and allow the gradual buildup of plaque. The exact cause of atherosclerosis is not fully understood. Symptoms of atherosclerosis do not occur until  an artery becomes narrow or blocked. What increases the risk? The following factors may make you more likely to develop this condition: Being middle-aged or older. Certain medical conditions, including: High blood pressure. High cholesterol. High blood fats (triglycerides). Diabetes. Sleep apnea. Obesity. Certain lab levels, including: Elevated C-reactive protein (CRP). This is a sign of increased inflammation in your body. Elevated homocysteine levels. This is an amino acid that is associated with heart and blood vessel disease. Using tobacco or nicotine products. A family history of atherosclerosis. Not exercising enough (sedentary lifestyle). Being stressed. Drinking too much alcohol or using drugs, such as cocaine or methamphetamine. What are the signs or symptoms? Symptoms of atherosclerosis do not occur until the plaque severely narrows or blocks the artery, which decreases blood flow. Sometimes, atherosclerosis does not cause symptoms. Symptoms of this condition include: Coronary artery disease. This may cause chest pain and shortness of breath. Decreased blood supply to your brain, which may cause a stroke. Signs of a stroke may include sudden: Weakness or numbness in your face, arm, or leg, especially on one side of your body. Trouble walking or difficulty moving your arms or legs. Loss of balance or coordination. Confusion. Slurred speech. Trouble speaking, or trouble understanding speech, or both (aphasia). Vision changes in one or both eyes. This may be double vision, blurred vision, or loss of vision. Severe headache with no known cause. The headache is often described as the worst headache ever experienced. PAD, which may cause pain, numbness, or nonhealing wounds, often in your legs and hips. Renal failure. This may cause tiredness, problems with urination, swelling, and itchy skin. How is this diagnosed? This condition is diagnosed based on  your medical history and  a physical exam. During the exam, your health care provider will: Check your pulse in different places. Listen for a "whooshing" sound over your arteries (bruit). You may also have tests, such as: Blood tests to check your levels of cholesterol, triglycerides, blood sugar, and CRP. Ankle-brachial index to compare blood pressure in your arms to blood pressure in your ankles to see how your blood is flowing. Heart (cardiac) tests. Electrocardiogram (ECG) to check for heart damage. Stress test to see how your heart reacts to exercise. Ultrasound tests. Ultrasound of your peripheral arteries to check blood flow. Echocardiogram to get images of your heart's chambers and valves. X-ray tests. Chest X-ray to see if you have an enlarged heart, which is a sign of heart failure. CT scan to check for damage to your heart, brain, or arteries. Angiogram. This is a test where dye is injected and X-rays are used to see the blood flow in the arteries. How is this treated? This condition is treated with lifestyle changes as the first step. These may include: Changing your diet. Losing weight. Reducing stress. Exercising and being physically active more regularly. Quitting smoking. You may also need medicine to: Lower triglycerides and cholesterol. Control blood pressure. Prevent blood clots. Lower inflammation in your body. Control your blood sugar. Sometimes, surgery is needed to: Remove plaque from an artery (endarterectomy). Open or widen a narrowed heart artery or peripheral artery (angioplasty). Create a new path for your blood with one of these procedures: Heart (coronary) artery bypass graft surgery. Peripheral artery bypass graft surgery. Place a small mesh tube (stent) in an artery to open or widen a narrowed artery. Follow these instructions at home: Eating and drinking  Eat a heart-healthy diet. Talk with your health care provider or a dietitian if you need help. A heart-healthy diet  involves: Limiting unhealthy fats and increasing healthy fats. Some examples of healthy fats are avocados and olive oil. Eating plant-based foods, such as fruits, vegetables, nuts, whole grains, and legumes (such as peas and lentils). If you drink alcohol: Limit how much you have to: 0-1 drink a day for women who are not pregnant. 0-2 drinks a day for men. Know how much alcohol is in a drink. In the [place] one drink equals one 12 oz bottle of beer (355 mL), one 5 oz glass of wine (148 mL), or one 1 oz glass of hard liquor (44 mL). Lifestyle  Maintain a healthy weight. Lose weight if your health care provider says that you need to do that. Follow an exercise program as told by your health care provider. Do not use any products that contain nicotine or tobacco. These products include cigarettes, chewing tobacco, and vaping devices, such as e-cigarettes. If you need help quitting, ask your health care provider. Do not use drugs. General instructions Take over-the-counter and prescription medicines only as told by your health care provider. Manage other health conditions as told. Keep all follow-up visits. This is important. Contact a health care provider if you have: An irregular heartbeat. Unexplained tiredness (fatigue). Trouble urinating, or you are producing less urine or foamy urine. Swelling of your hands or feet, or itchy skin. Unexplained pain or numbness in your legs or hips. A wound that is slow to heal or is not healing. Get help right away if: You have any symptoms of a heart attack. These may be: Chest pain. This includes squeezing chest pain that may feel like indigestion (angina). Shortness of breath. Pain in  your neck, jaw, arms, back, or stomach. Cold sweat. Nausea. Light-headedness. Sudden pain, numbness, or coldness in a limb. You have any symptoms of a stroke. "BE FAST" is an easy way to remember the main warning signs of a stroke: B - Balance. Signs are dizziness,  sudden trouble walking, or loss of balance. E - Eyes. Signs are trouble seeing or a sudden change in vision. F - Face. Signs are sudden weakness or numbness of the face, or the face or eyelid drooping on one side. A - Arms. Signs are weakness or numbness in an arm. This happens suddenly and usually on one side of the body. S - Speech. Signs are sudden trouble speaking, slurred speech, or trouble understanding what people say. T - Time. Time to call emergency services. Write down what time symptoms started. You have other signs of a stroke, such as: A sudden, severe headache with no known cause. Nausea or vomiting. Seizure. These symptoms may represent a serious problem that is an emergency. Do not wait to see if the symptoms will go away. Get medical help right away. Call your local emergency services (911 in the [place] Do not drive yourself to the hospital. Summary Atherosclerosis is when plaque builds up in the arteries and causes narrowing and hardening of the arteries. Plaque occurs due to inflammation or from a buildup of fat, cholesterol, calcium, cellular waste products, and fibrin. This condition may not cause any symptoms. Symptoms of atherosclerosis do not occur until the plaque severely narrows or blocks the artery. Treatment starts with lifestyle changes and may include medicines. In some cases, surgery is needed. Get help right away if you have any symptoms of a heart attack or stroke. This information is not intended to replace advice given to you by your health care provider. Make sure you discuss any questions you have with your health care provider. Document Revised: 09/19/2020 Document Reviewed: 09/19/2020 Elsevier Patient Education  [place].

## 2021-12-10 NOTE — Assessment & Plan Note (Signed)
Planning on starting PT. Wt loss will help. Continue following with Dr Tommi Rumps.

## 2021-12-10 NOTE — Assessment & Plan Note (Addendum)
Mild and since 08/2020. K+ rich diet to continue. Further recommendations according to BMP result.

## 2021-12-12 MED ORDER — POTASSIUM CHLORIDE CRYS ER 20 MEQ PO TBCR: 20.0000 meq | EXTENDED_RELEASE_TABLET | Freq: Every day | ORAL | 3 refills | Status: DC

## 2021-12-12 MED ORDER — ATORVASTATIN CALCIUM 20 MG PO TABS: 20.0000 mg | ORAL_TABLET | Freq: Every day | ORAL | 1 refills | Status: DC

## 2022-01-06 ENCOUNTER — Other Ambulatory Visit: Payer: Commercial Managed Care - HMO

## 2022-01-09 ENCOUNTER — Ambulatory Visit: Payer: Commercial Managed Care - HMO | Admitting: Family Medicine

## 2022-01-13 ENCOUNTER — Other Ambulatory Visit (INDEPENDENT_AMBULATORY_CARE_PROVIDER_SITE_OTHER): Payer: Commercial Managed Care - HMO

## 2022-01-13 DIAGNOSIS — E876 Hypokalemia: Secondary | ICD-10-CM | POA: Diagnosis not present

## 2022-01-13 LAB — POTASSIUM: Potassium: 4.3 mEq/L (ref 3.5–5.1)

## 2022-01-16 ENCOUNTER — Ambulatory Visit: Payer: Commercial Managed Care - HMO | Admitting: Family Medicine

## 2022-01-16 ENCOUNTER — Encounter: Payer: Self-pay | Admitting: Family Medicine

## 2022-01-16 VITALS — BP 138/86 | HR 68 | Ht 59.0 in | Wt 213.8 lb

## 2022-01-16 DIAGNOSIS — H547 Unspecified visual loss: Secondary | ICD-10-CM | POA: Diagnosis not present

## 2022-01-16 DIAGNOSIS — G8929 Other chronic pain: Secondary | ICD-10-CM

## 2022-01-16 DIAGNOSIS — M25562 Pain in left knee: Secondary | ICD-10-CM

## 2022-01-16 DIAGNOSIS — M545 Low back pain, unspecified: Secondary | ICD-10-CM | POA: Diagnosis not present
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
## 2022-01-16 NOTE — Patient Instructions (Addendum)
Good to see you today.  I've referred you to home health PT.  Their office will call you to schedule but please let us know if you don't hear from them in one week regarding scheduling.  Follow-up: 2 months

## 2022-05-14 ENCOUNTER — Ambulatory Visit: Payer: Commercial Managed Care - HMO | Admitting: Family Medicine

## 2022-05-19 ENCOUNTER — Ambulatory Visit: Payer: Commercial Managed Care - HMO | Admitting: Family Medicine

## 2022-06-02 ENCOUNTER — Encounter: Payer: Commercial Managed Care - HMO | Admitting: Family Medicine
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
## 2022-06-03 ENCOUNTER — Ambulatory Visit (INDEPENDENT_AMBULATORY_CARE_PROVIDER_SITE_OTHER): Payer: Commercial Managed Care - HMO | Admitting: Family Medicine

## 2022-06-03 ENCOUNTER — Encounter: Payer: Self-pay | Admitting: Family Medicine

## 2022-06-03 VITALS — BP 128/80 | HR 100 | Resp 16 | Ht 59.0 in | Wt 207.0 lb

## 2022-06-03 DIAGNOSIS — R7303 Prediabetes: Secondary | ICD-10-CM | POA: Diagnosis not present

## 2022-06-03 DIAGNOSIS — F419 Anxiety disorder, unspecified: Secondary | ICD-10-CM | POA: Diagnosis not present

## 2022-06-03 DIAGNOSIS — R5383 Other fatigue: Secondary | ICD-10-CM | POA: Diagnosis not present

## 2022-06-03 DIAGNOSIS — E785 Hyperlipidemia, unspecified: Secondary | ICD-10-CM

## 2022-06-03 LAB — CBC
HCT: 39.9 % (ref 36.0–46.0)
Hemoglobin: 13.5 g/dL (ref 12.0–15.0)
MCHC: 33.8 g/dL (ref 30.0–36.0)
MCV: 89.2 fl (ref 78.0–100.0)
Platelets: 288 10*3/uL (ref 150.0–400.0)
RBC: 4.47 Mil/uL (ref 3.87–5.11)
RDW: 14.3 % (ref 11.5–15.5)
WBC: 5.6 10*3/uL (ref 4.0–10.5)

## 2022-06-03 LAB — LIPID PANEL
Cholesterol: 292 mg/dL — ABNORMAL HIGH (ref 0–200)
HDL: 64.1 mg/dL (ref >39.00)
LDL Cholesterol: 192 mg/dL — ABNORMAL HIGH (ref 0–99)
NonHDL: 227.81
Total CHOL/HDL Ratio: 5
Triglycerides: 180 mg/dL — ABNORMAL HIGH (ref 0.0–149.0)
VLDL: 36 mg/dL (ref 0.0–40.0)

## 2022-06-03 LAB — HEMOGLOBIN A1C: Hgb A1c MFr Bld: 6.2 % (ref 4.6–6.5)

## 2022-06-03 LAB — TSH: TSH: 1.39 u[IU]/mL (ref 0.35–5.50)

## 2022-06-03 MED ORDER — HYDROXYZINE HCL 25 MG PO TABS: 25.0000 mg | ORAL_TABLET | Freq: Two times a day (BID) | ORAL | 1 refills | Status: DC | PRN

## 2022-06-03 NOTE — Patient Instructions (Addendum)
A few things to remember from today's visit:  Hyperlipidemia, unspecified hyperlipidemia type - Plan: Lipid panel  Prediabetes - Plan: Hemoglobin A1c  Fatigue, unspecified type - Plan: TSH, CBC  Anxiety disorder, unspecified type - Plan: hydrOXYzine (ATARAX) 25 MG tablet  Will consider a different cholesterol medication. Please establish with psychotherapist. Anxiety medication can cause drowsiness.  If you need refills for medications you take chronically, please call your pharmacy. Do not use My Chart to request refills or for acute issues that need immediate attention. If you send a my chart message, it may take a few days to be addressed, specially if I am not in the office.  Please be sure medication list is accurate. If a new problem present, please set up appointment sooner than planned today.

## 2022-06-03 NOTE — Assessment & Plan Note (Addendum)
She stopped atorvastatin 20 mg daily because nausea, which resolved after stopped statin.  Continue low-fat diet. Further recommendation will be given according to lipid panel result.

## 2022-06-05 NOTE — Assessment & Plan Note (Signed)
She is not interested in daily medication like Sertraline or other SSRI.  Hydroxyzine 25 mg bid prn recommended , side effects discussed. List of provides in the area given,so she can call to arrange appt with psychotherapist. Stressed the importance of not taking someone else's medications.

## 2022-06-05 NOTE — Assessment & Plan Note (Signed)
Possible etiologies discussed, recent viral illness and chronic pain can be contributing factors. Mild anemia in 08/2020, will re-check today. Further recommendations will be given according to lab results.

## 2022-06-05 NOTE — Assessment & Plan Note (Signed)
A healthy life style encouraged for diabetes prevention. Further recommendations according to HgA1C result.

## 2022-06-06 ENCOUNTER — Telehealth: Payer: Self-pay | Admitting: Family Medicine

## 2022-06-06 ENCOUNTER — Other Ambulatory Visit: Payer: Self-pay

## 2022-06-06 MED ORDER — ROSUVASTATIN CALCIUM 10 MG PO TABS: 10.0000 mg | ORAL_TABLET | Freq: Every day | ORAL | 3 refills | Status: DC

## 2022-06-06 NOTE — Telephone Encounter (Signed)
Pt called to get her Lab results from 12/5 lab appt. Stated she got her mychart message that they were ready but she couldn't get into her mychart.   Pt would like a call from the [facility] to review her results.   Please advise.

## 2022-06-06 NOTE — Telephone Encounter (Signed)
Left voicemail for patient to call the office back, see result note.

## 2022-06-20 ENCOUNTER — Telehealth: Payer: Self-pay

## 2022-06-20 NOTE — Telephone Encounter (Signed)
I spoke with patient. She would like to try one of the different medications you mentioned.

## 2022-06-25 ENCOUNTER — Other Ambulatory Visit: Payer: Self-pay | Admitting: Family Medicine

## 2022-06-25 DIAGNOSIS — F419 Anxiety disorder, unspecified: Secondary | ICD-10-CM

## 2022-06-25 MED ORDER — CITALOPRAM HYDROBROMIDE 10 MG PO TABS: 10.0000 mg | ORAL_TABLET | Freq: Every day | ORAL | 2 refills | Status: DC

## 2022-06-25 NOTE — Telephone Encounter (Signed)
Celexa 10 mg sent to take 1 tab daily. F/U in 8 weeks. Thanks, BJ

## 2022-06-27 NOTE — Telephone Encounter (Signed)
I left pt a voicemail letting her know Rx was sent in & to call back to schedule 8 week f/u.

## 2022-07-25 ENCOUNTER — Telehealth: Payer: Self-pay | Admitting: Family Medicine

## 2022-07-25 NOTE — Telephone Encounter (Signed)
I left patient a voicemail to call the office back. Please go ahead and schedule a follow up visit to discuss medications - I advised patient on voicemail we would need to go ahead and do this.

## 2022-07-25 NOTE — Telephone Encounter (Signed)
Patient requesting to speak with someone regarding her requesting to speak with you about citalopram (CELEXA) 10 MG tablet hydrOXYzine (ATARAX) 25 MG tablet

## 2022-08-05 NOTE — Telephone Encounter (Signed)
Pt has been sch for 08-12-2022

## 2022-08-12 ENCOUNTER — Ambulatory Visit: Payer: Commercial Managed Care - HMO | Admitting: Family Medicine

## 2022-09-01 NOTE — Progress Notes (Deleted)
     HPI: Tammy Tucker is a 62 y.o. female, who is here today for chronic disease management.  Last seen on 06/03/22  *** Review of Systems See other pertinent positives and negatives in HPI.  Current Outpatient Medications on File Prior to Visit  Medication Sig Dispense Refill   AMBULATORY NON FORMULARY MEDICATION Cane Dispense 1 M25.562 Use as needed 1 Product 0   citalopram (CELEXA) 10 MG tablet Take 1 tablet (10 mg total) by mouth daily. 30 tablet 2   hydrOXYzine (ATARAX) 25 MG tablet Take 1 tablet (25 mg total) by mouth 2 (two) times daily as needed for itching. 60 tablet 1   latanoprost (XALATAN) 0.005 % ophthalmic solution Place 1 drop into both eyes at bedtime.     Multiple Vitamin (MULTIVITAMIN) tablet Take 1 tablet by mouth daily.     rosuvastatin (CRESTOR) 10 MG tablet Take 1 tablet (10 mg total) by mouth daily. 90 tablet 3   No current facility-administered medications on file prior to visit.    Past Medical History:  Diagnosis Date   Fibroids    Glaucoma    Obesity    Retinal pigmentation    Vitamin D deficiency    Allergies  Allergen Reactions   Codeine Itching    Blisters around mouth   Tylenol With Codeine #3 [Acetaminophen-Codeine] Itching    Social History   Socioeconomic History   Marital status: Married    Spouse name: Not on file   Number of children: 3   Years of education: Not on file   Highest education level: Not on file  Occupational History   Not on file  Tobacco Use   Smoking status: Never   Smokeless tobacco: Never  Vaping Use   Vaping Use: Not on file  Substance and Sexual Activity   Alcohol use: Never   Drug use: Never   Sexual activity: Yes    Partners: Male  Other Topics Concern   Not on file  Social History Narrative   ** Merged History Encounter **       Social Determinants of Health   Financial Resource Strain: Not on file  Food Insecurity: Not on file  Transportation Needs: Not on file  Physical Activity:  Not on file  Stress: Not on file  Social Connections: Not on file    There were no vitals filed for this visit. There is no height or weight on file to calculate BMI.  Physical Exam  ASSESSMENT AND PLAN:  There are no diagnoses linked to this encounter.  No orders of the defined types were placed in this encounter.   No problem-specific Assessment & Plan notes found for this encounter.   No follow-ups on file.  Betty G. Martinique, MD  [facility]. [place] office.

## 2022-09-02 ENCOUNTER — Ambulatory Visit: Payer: Commercial Managed Care - HMO | Admitting: Family Medicine

## 2022-09-03 NOTE — Progress Notes (Signed)
HPI: Tammy Tucker is a 62 y.o. female, who is here today for follow-up. She was last seen on 06/03/2022, when she was reporting worsening anxiety. Hydroxyzine was started, which was helping. She called complaining of side effects, blurry vision.   Medication was helping with sleep. Citalopram 10 mg was recommended, took it until 2 weeks ago, she was taking med as needed.  She was taking Citalopram in the morning initially, changed to night because caused sleepiness.  Still having episodes of active anxiety, she would like something she can take prn.  She states that in the past she was using Ativan, which she found effective. Her son's psychiatry problems have exacerbated her anxiety. States that he is more complaint with his medications, he is living with her.  HLD: She is on rosuvastatin 10 mg daily and reports no issues with this medication. She mentions having stopped atorvastatin due to side effects of nausea and headache. She is trying to follow a healthful diet, noticed some weight loss, but admits to occasional indulgence in unhealthy foods. Aortic atherosclerosis seen on imaging in 08/2020.  Lab Results  Component Value Date   CHOL 292 (H) 06/03/2022   HDL 64.10 06/03/2022   LDLCALC 192 (H) 06/03/2022   TRIG 180.0 (H) 06/03/2022   CHOLHDL 5 06/03/2022   Lab Results  Component Value Date   ALT 16 10/24/2019   AST 16 10/24/2019   ALKPHOS 85 10/24/2019   BILITOT 0.4 10/24/2019   She mentions that she experienced a recent eye injury,almost fell and hit periocular area a week ago. She states that she called her eye care provider and was told she did not need to be seen; later she states that she was told to go to their office to be evaluated but she wanted to see her eye care provider, who was not available at the time and she was out of town. She has been using eye drops and feeling better. She has an upcoming appointment with her eye care provider to further assess the  injury. Using natural tears. No visual changes, not conjunctival erythema or edema, or headaches.   C/O join pain, upper and lower back pain. Follows with Sports Medicine, Dr Kandee Keen. Generalized arthralgias and back pain, worse with weather changes, rainy days are worse. Received a left intra articular knee injection today and it is helping.  Vit D def, she is not on vit D supplementation. Last 25 OH vit D 19.5 in 09/2019.  Prediabetes: Negative for polydipsia,polyuria, or polyphagia. Lab Results  Component Value Date   HGBA1C 6.2 06/03/2022   She is also reporting having a history of fibroids tumors. She is planning gon arranging her mammogram. She is due for Pap smear. She plans to schedule these appointments with her gynecologist.  Review of Systems  Constitutional:  Negative for activity change, appetite change and fever.  HENT:  Negative for mouth sores, nosebleeds and trouble swallowing.   Respiratory:  Negative for cough and wheezing.   Cardiovascular:  Negative for chest pain and palpitations.  Gastrointestinal:  Negative for abdominal pain, nausea and vomiting.       Negative for changes in bowel habits.  Genitourinary:  Negative for decreased urine volume, dysuria and hematuria.  Musculoskeletal:  Positive for arthralgias and back pain. Negative for gait problem.  Skin:  Negative for rash.  Neurological:  Negative for syncope, facial asymmetry and weakness.  Psychiatric/Behavioral:  Negative for hallucinations. The patient is nervous/anxious.   See other pertinent positives and  negatives in HPI.  Current Outpatient Medications on File Prior to Visit  Medication Sig Dispense Refill   AMBULATORY NON FORMULARY MEDICATION Cane Dispense 1 M25.562 Use as needed 1 Product 0   latanoprost (XALATAN) 0.005 % ophthalmic solution Place 1 drop into both eyes at bedtime.     Multiple Vitamin (MULTIVITAMIN) tablet Take 1 tablet by mouth daily.     rosuvastatin (CRESTOR) 10 MG tablet  Take 1 tablet (10 mg total) by mouth daily. 90 tablet 3   No current facility-administered medications on file prior to visit.   Past Medical History:  Diagnosis Date   Fibroids    Glaucoma    Obesity    Retinal pigmentation    Vitamin D deficiency    Allergies  Allergen Reactions   Codeine Itching    Blisters around mouth   Tylenol With Codeine #3 [Acetaminophen-Codeine] Itching   Social History   Socioeconomic History   Marital status: Married    Spouse name: Not on file   Number of children: 3   Years of education: Not on file   Highest education level: Not on file  Occupational History   Not on file  Tobacco Use   Smoking status: Never   Smokeless tobacco: Never  Vaping Use   Vaping Use: Not on file  Substance and Sexual Activity   Alcohol use: Never   Drug use: Never   Sexual activity: Yes    Partners: Male  Other Topics Concern   Not on file  Social History Narrative   ** Merged History Encounter **       Social Determinants of Health   Financial Resource Strain: Not on file  Food Insecurity: Not on file  Transportation Needs: Not on file  Physical Activity: Not on file  Stress: Not on file  Social Connections: Not on file   Vitals:   09/05/22 1406  BP: 128/80  Pulse: 96  Resp: 16  SpO2: 98%   Wt Readings from Last 3 Encounters:  09/05/22 210 lb (95.3 kg)  09/05/22 210 lb (95.3 kg)  06/03/22 207 lb (93.9 kg)   Body mass index is 42.41 kg/m.  Physical Exam Vitals and nursing note reviewed.  Constitutional:      General: She is not in acute distress.    Appearance: She is well-developed.  HENT:     Head: Normocephalic and atraumatic.     Mouth/Throat:     Mouth: Mucous membranes are moist.     Pharynx: Oropharynx is clear.  Eyes:     Conjunctiva/sclera: Conjunctivae normal.  Cardiovascular:     Rate and Rhythm: Normal rate and regular rhythm.     Pulses:          Posterior tibial pulses are 2+ on the right side and 2+ on the left  side.     Heart sounds: No murmur heard. Pulmonary:     Effort: Pulmonary effort is normal. No respiratory distress.     Breath sounds: Normal breath sounds.  Abdominal:     Palpations: Abdomen is soft. There is no hepatomegaly or mass.     Tenderness: There is no abdominal tenderness.  Lymphadenopathy:     Cervical: No cervical adenopathy.  Skin:    General: Skin is warm.     Findings: No erythema or rash.  Neurological:     General: No focal deficit present.     Mental Status: She is alert and oriented to person, place, and time.  Cranial Nerves: No cranial nerve deficit.     Comments: Antalgic gait, not assisted.  Psychiatric:        Mood and Affect: Mood is anxious. Mood is not depressed.   ASSESSMENT AND PLAN:  Ms. Shambria was seen today for medication management.  Diagnoses and all orders for this visit:  Mixed hyperlipidemia Assessment & Plan: We discussed CV benefits of statins. Continue Rosuvastatin 10 mg daily and low fat diet. She is not fasting today, so will arrange appt for fasting labs in a couple of months.  Orders: -     Comprehensive metabolic panel; Future -     Lipid panel; Future  Anxiety disorder, unspecified type Assessment & Plan: Citalopram helped, she has not taken medication for 2 weeks and was doing so prn. Explained that this medication is to take daily, resume Citalopram 10 mg daily. She feels like Hydroxyzine helped, she agrees with trying medication again but lower dose, 25 mg 1/2 tab bid prn. Planning on establishing with psychotherapist. Instructed about warning signs. F/U in 6 months, before  if needed.  Orders: -     Citalopram Hydrobromide; Take 1 tablet (10 mg total) by mouth daily.  Dispense: 90 tablet; Refill: 1 -     hydrOXYzine HCl; Take 0.5-1 tablets (12.5-25 mg total) by mouth 2 (two) times daily as needed for anxiety.  Dispense: 60 tablet; Refill: 1  Aortic atherosclerosis (HCC) Assessment & Plan: Not on antiplatelet  therapy. Continue Rosuvastatin 10 mg daily.  Orders: -     Comprehensive metabolic panel; Future -     Lipid panel; Future  Prediabetes Assessment & Plan: Encouraged consistency with a healthy life style for diabetes prevention. HgA1C added to next blood work.  Orders: -     Comprehensive metabolic panel; Future -     Hemoglobin A1c; Future  Vitamin D deficiency, unspecified Assessment & Plan: She is not on vitamin D supplementation. 25 OH vitamin D with next blood work.  Orders: -     Comprehensive metabolic panel; Future -     VITAMIN D 25 Hydroxy (Vit-D Deficiency, Fractures); Future  Superficial injury of left periocular region, initial encounter Reports improvement. No abnormalities on eye exam.  Keep appt with eye care provider. Instructed about warning signs.  Morbid obesity (HCC)  We discussed the benefits of wt loss as well as adverse effects of obesity. Consistency with healthy diet and physical activity encouraged.  Health maintenance She needs to call and schedule colonoscopy. Planning on arranging appt with her gyn and appt for mammogram.  I spent a total of 41 minutes in both face to face and non face to face activities for this visit on the date of this encounter. During this time history was obtained and documented, examination was performed, prior labs reviewed, and assessment/plan discussed.  Return in about 6 months (around 03/08/2023) for Labs in 2-3 months fasting., chronic problems.  Citlally Captain G. [place], MD  [facility]. [place] office.
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
## 2022-09-05 ENCOUNTER — Ambulatory Visit: Payer: Commercial Managed Care - HMO | Admitting: Family Medicine

## 2022-09-05 ENCOUNTER — Encounter: Payer: Self-pay | Admitting: Family Medicine

## 2022-09-05 ENCOUNTER — Ambulatory Visit (INDEPENDENT_AMBULATORY_CARE_PROVIDER_SITE_OTHER): Payer: Commercial Managed Care - HMO | Admitting: Family Medicine

## 2022-09-05 ENCOUNTER — Ambulatory Visit: Payer: Self-pay

## 2022-09-05 ENCOUNTER — Ambulatory Visit (INDEPENDENT_AMBULATORY_CARE_PROVIDER_SITE_OTHER): Payer: Commercial Managed Care - HMO

## 2022-09-05 ENCOUNTER — Telehealth: Payer: Self-pay | Admitting: Family Medicine

## 2022-09-05 VITALS — BP 162/98 | HR 88 | Ht 59.0 in | Wt 210.0 lb

## 2022-09-05 VITALS — BP 128/80 | HR 96 | Resp 16 | Ht 59.0 in | Wt 210.0 lb

## 2022-09-05 DIAGNOSIS — F419 Anxiety disorder, unspecified: Secondary | ICD-10-CM

## 2022-09-05 DIAGNOSIS — R7303 Prediabetes: Secondary | ICD-10-CM | POA: Diagnosis not present

## 2022-09-05 DIAGNOSIS — M25562 Pain in left knee: Secondary | ICD-10-CM

## 2022-09-05 DIAGNOSIS — G8929 Other chronic pain: Secondary | ICD-10-CM

## 2022-09-05 DIAGNOSIS — M25552 Pain in left hip: Secondary | ICD-10-CM

## 2022-09-05 DIAGNOSIS — M545 Low back pain, unspecified: Secondary | ICD-10-CM

## 2022-09-05 DIAGNOSIS — H547 Unspecified visual loss: Secondary | ICD-10-CM | POA: Diagnosis not present

## 2022-09-05 DIAGNOSIS — E782 Mixed hyperlipidemia: Secondary | ICD-10-CM | POA: Diagnosis not present

## 2022-09-05 DIAGNOSIS — I700 Atherosclerosis of aorta: Secondary | ICD-10-CM

## 2022-09-05 DIAGNOSIS — S00202A Unspecified superficial injury of left eyelid and periocular area, initial encounter: Secondary | ICD-10-CM

## 2022-09-05 DIAGNOSIS — S00202D Unspecified superficial injury of left eyelid and periocular area, subsequent encounter: Secondary | ICD-10-CM

## 2022-09-05 DIAGNOSIS — E559 Vitamin D deficiency, unspecified: Secondary | ICD-10-CM

## 2022-09-05 MED ORDER — CITALOPRAM HYDROBROMIDE 10 MG PO TABS: 10.0000 mg | ORAL_TABLET | Freq: Every day | ORAL | 1 refills | Status: DC

## 2022-09-05 MED ORDER — HYDROXYZINE HCL 25 MG PO TABS: 12.5000 mg | ORAL_TABLET | Freq: Two times a day (BID) | ORAL | 1 refills | Status: DC | PRN

## 2022-09-05 NOTE — Patient Instructions (Addendum)
Thank you for coming in today.   Please get an Xray today before you leave   I've referred you to [facility].  Let us know if you don't hear from them in one week.   You received an injection today. Seek immediate medical attention if the joint becomes red, extremely painful, or is oozing fluid.   Check back in 1 month
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
## 2022-09-05 NOTE — Patient Instructions (Addendum)
A few things to remember from today's visit:  Mixed hyperlipidemia  Anxiety disorder, unspecified type - Plan: citalopram (CELEXA) 10 MG tablet  Aortic atherosclerosis ([place]), Chronic  Prediabetes  Vitamin D deficiency, unspecified  Resume Citalopram 10 mg daily. Try Hydroxyzine again but 1/2 tab. Fasting labs in 2-3 months. I can see you back in 6 months to follow on anxiety as far as you establish with psychotherapist.  Please arrange appt for mammogram and with your gynecologist for your pap smear.  If you need refills for medications you take chronically, please call your pharmacy. Do not use My Chart to request refills or for acute issues that need immediate attention. If you send a my chart message, it may take a few days to be addressed, specially if I am not in the office.  Please be sure medication list is accurate. If a new problem present, please set up appointment sooner than planned today.

## 2022-09-06 NOTE — Assessment & Plan Note (Signed)
Not on antiplatelet therapy. Continue Rosuvastatin 10 mg daily.

## 2022-09-06 NOTE — Assessment & Plan Note (Signed)
Encouraged consistency with a healthy life style for diabetes prevention. HgA1C added to next blood work.

## 2022-09-06 NOTE — Assessment & Plan Note (Addendum)
Citalopram helped, she has not taken medication for 2 weeks and was doing so prn. Explained that this medication is to take daily, resume Citalopram 10 mg daily. She feels like Hydroxyzine helped, she agrees with trying medication again but lower dose, 25 mg 1/2 tab bid prn. Planning on establishing with psychotherapist. Instructed about warning signs. F/U in 6 months, before  if needed.

## 2022-09-06 NOTE — Assessment & Plan Note (Signed)
She is not on vitamin D supplementation. 25 OH vitamin D with next blood work.

## 2022-09-06 NOTE — Assessment & Plan Note (Signed)
We discussed CV benefits of statins. Continue Rosuvastatin 10 mg daily and low fat diet. She is not fasting today, so will arrange appt for fasting labs in a couple of months.

## 2022-09-06 NOTE — Assessment & Plan Note (Signed)
We discussed the benefits of wt loss as well as adverse effects of obesity. Consistency with healthy diet and physical activity encouraged.

## 2022-09-08 NOTE — Telephone Encounter (Signed)
So far [facility] and [facility] are unable to help Was told to try [facility], faxed them the order

## 2022-09-08 NOTE — Progress Notes (Signed)
Lumbar spine x-ray shows again some arthritis changes at the junction between the spine and the pelvis.Tammy Tucker

## 2022-09-08 NOTE — Progress Notes (Signed)
Left hip x-ray shows a little bit of arthritis changes in the SI joint.  This is the joint between the spine and the pelvis.  The hip itself has no fracture or dislocation.  No bad hip arthritis.

## 2022-09-08 NOTE — Telephone Encounter (Signed)
Checking a few other places to see if they can help

## 2022-09-09 NOTE — Telephone Encounter (Signed)
We have not seen anything.

## 2022-09-09 NOTE — Telephone Encounter (Signed)
Did we receive a call or fax from [facility] saying they could not take patient on?

## 2022-10-03 ENCOUNTER — Other Ambulatory Visit: Payer: Self-pay

## 2022-10-03 DIAGNOSIS — F419 Anxiety disorder, unspecified: Secondary | ICD-10-CM

## 2022-10-03 MED ORDER — HYDROXYZINE HCL 25 MG PO TABS: 12.5000 mg | ORAL_TABLET | Freq: Two times a day (BID) | ORAL | 1 refills | Status: DC | PRN

## 2022-10-09 ENCOUNTER — Ambulatory Visit: Payer: Commercial Managed Care - HMO | Admitting: Family Medicine

## 2023-01-28 ENCOUNTER — Other Ambulatory Visit: Payer: Self-pay | Admitting: Family Medicine

## 2023-01-28 DIAGNOSIS — Z1231 Encounter for screening mammogram for malignant neoplasm of breast: Secondary | ICD-10-CM

## 2023-02-12 ENCOUNTER — Encounter (INDEPENDENT_AMBULATORY_CARE_PROVIDER_SITE_OTHER): Payer: Self-pay
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
## 2023-02-28 IMAGING — RF DG C-ARM 1-60 MIN
1 series · 2 of 2 positions shown · non-contrast
Comparison: None.

CLINICAL DATA: Right clavicular fracture repair

EXAM:
DG C-ARM 1-60 MIN
FLUOROSCOPY TIME:  Fluoroscopy Time:  9 seconds
Radiation Exposure Index (if provided by the fluoroscopic device):
1.13
Number of Acquired Spot Images: 2

[Series 1: run · 2 of 2 slices shown]
[im 1/2]
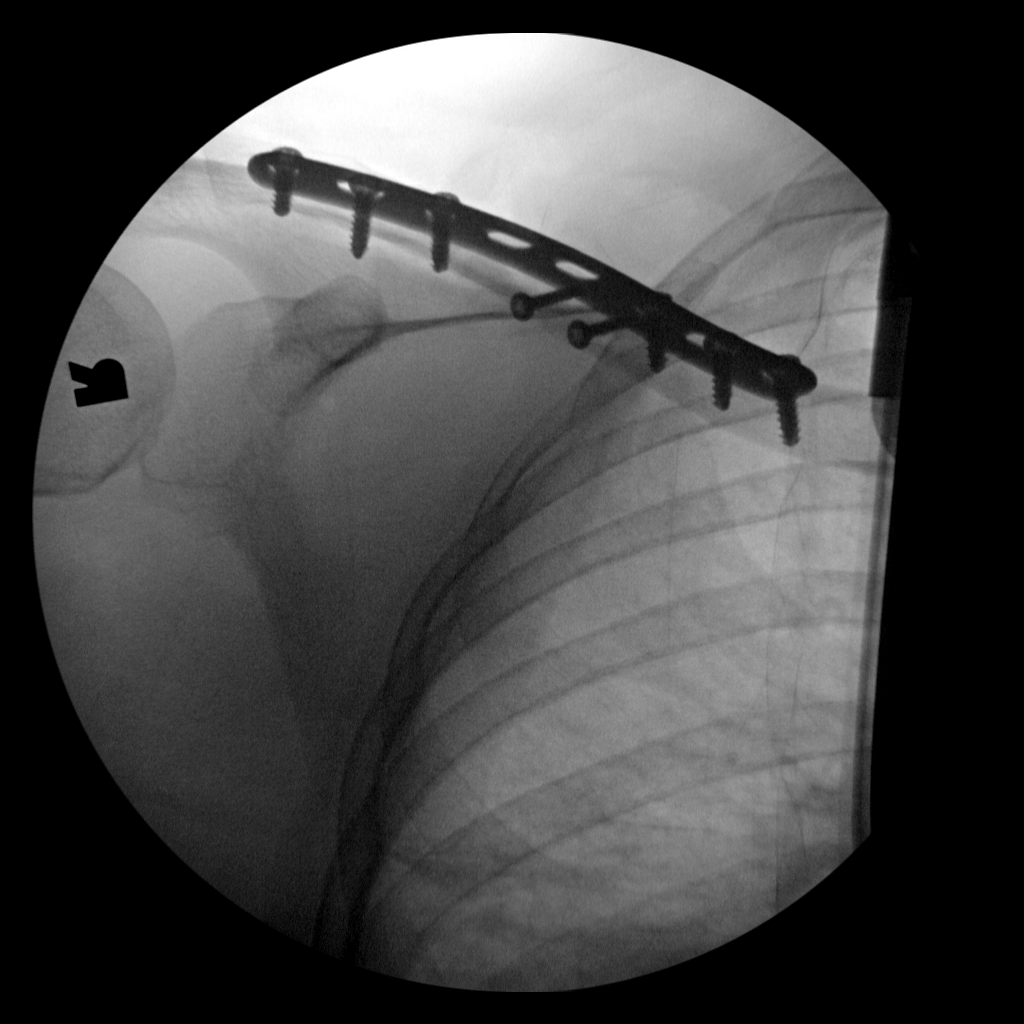
[im 2/2]
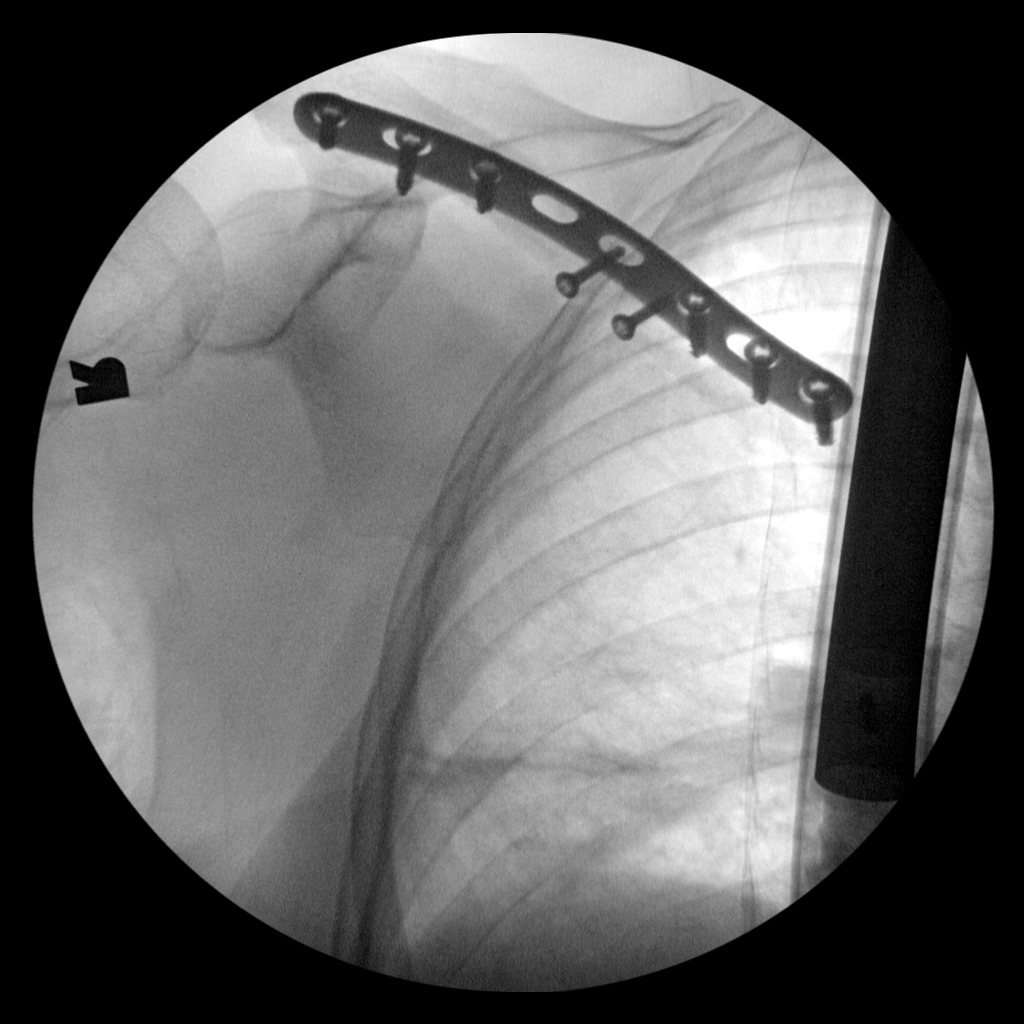

[2 of 2 positions shown; findings below may reference images not displayed]

FINDINGS: The right clavicular fracture has been repaired with a plate and
screws. Displacement has been reduced.
IMPRESSION: Right clavicular fracture repair and reduction.

## 2023-03-05 ENCOUNTER — Encounter: Payer: Self-pay | Admitting: Family Medicine

## 2023-03-05 ENCOUNTER — Ambulatory Visit: Payer: Medicare HMO | Admitting: Family Medicine

## 2023-03-05 ENCOUNTER — Other Ambulatory Visit: Payer: Self-pay

## 2023-03-05 VITALS — BP 158/96 | HR 84 | Ht 59.0 in | Wt 210.0 lb

## 2023-03-05 DIAGNOSIS — M25551 Pain in right hip: Secondary | ICD-10-CM

## 2023-03-05 DIAGNOSIS — R6889 Other general symptoms and signs: Secondary | ICD-10-CM | POA: Diagnosis not present

## 2023-03-05 DIAGNOSIS — M7989 Other specified soft tissue disorders: Secondary | ICD-10-CM | POA: Diagnosis not present

## 2023-03-05 DIAGNOSIS — M79661 Pain in right lower leg: Secondary | ICD-10-CM

## 2023-03-05 DIAGNOSIS — G8929 Other chronic pain: Secondary | ICD-10-CM

## 2023-03-05 DIAGNOSIS — M545 Low back pain, unspecified: Secondary | ICD-10-CM | POA: Diagnosis not present

## 2023-03-05 MED ORDER — TIZANIDINE HCL 4 MG PO TABS: 2.0000 mg | ORAL_TABLET | Freq: Three times a day (TID) | ORAL | 1 refills | Status: DC | PRN

## 2023-03-05 MED ORDER — PREDNISONE 50 MG PO TABS: 50.0000 mg | ORAL_TABLET | Freq: Every day | ORAL | 0 refills | Status: DC

## 2023-03-05 NOTE — Progress Notes (Signed)
I, Stevenson Clinch, CMA acting as a scribe for [name], MD.  Tammy Tucker is a 62 y.o. female who presents to [facility] at [facility], [place] today for con't low back, L knee and L hip pain. Pt was last seen by Dr. Denyse Amass on 09/05/22 and was given a L knee steroid injection and she was referred to home health PT.  Today, pt reports pain and pressure right side lower back, radiating into the gluteal region and R LE. Has tried IBU. Sx x 1.5 weeks. Notes increased clicking in the lower body. Pain with position changes. Sx less severe when laying, especially on the left side. Sx worse with bending, though sx have improved since onset.   Pain is worse right low back extending into the right lateral hip.  Not much pain radiating down below the level of the knee.    She does note some lower leg swelling.  Dx imaging: 09/05/22 L hip & L-spine XR  07/12/21 L knee XR  Pertinent review of systems: No fevers or chills  Relevant historical information: Obesity and prediabetes.   Exam:  BP (!) 158/96   Pulse 84   Ht 4\' 11"  (1.499 m)   Wt 210 lb (95.3 kg)   LMP 06/01/2013   SpO2 98%   BMI 42.41 kg/m  General: Well Developed, well nourished, and in no acute distress.   MSK: L-spine: Normal appearing. Nontender palpation spinal midline.  Decreased lumbar motion. Lower extremity strength is generally intact.  Right hip: Normal-appearing Tender palpation greater trochanter.  Hip abduction strength is intact with some pain.  Right lower leg minimal Swelling.  Nontender.  Lab and Radiology Results  EXAM: DG HIP (WITH OR WITHOUT PELVIS) 2-3V LEFT; LUMBAR SPINE - 2-3 VIEW   COMPARISON:  CT of the abdomen and pelvis 09/07/2020. Pelvic radiographs 09/07/2020.   FINDINGS: Lumbar spine: 5 lumbar type vertebral bodies. The alignment is normal aside from a minimal convex left scoliosis. The disc spaces are preserved. No evidence of acute fracture or pars defect. Minimal facet  degenerative changes inferiorly.   Left hip: AP pelvis with AP and frog-leg lateral views of the left hip. The mineralization and alignment are normal. There is no evidence of acute fracture or dislocation. No evidence of femoral head osteonecrosis. The hip joint spaces are preserved. There are mild sacroiliac degenerative changes bilaterally. The soft tissues appear unremarkable.   IMPRESSION: No acute osseous findings or significant degenerative changes in the lumbar spine or left hip. Mild sacroiliac degenerative changes bilaterally.     Electronically Signed   By: Carey Bullocks M.D.   On: 09/07/2022 12:42 I, [name], personally (independently) visualized and performed the interpretation of the images attached in this note.      Assessment and Plan: 61 y.o. female with acute exacerbation right low back pain and right lateral hip pain.  This is a very similar problem that she had back in March on the other side.  Pain thought to be due to muscle spasm and dysfunction.  She does not have an injury and has no tenderness along the spinal midline so I think compression fracture is very unlikely.  This more likely to be muscle spasm and dysfunction.  We talked about options.  She would like to avoid a steroid injection today.  Will use tizanidine and short course of prednisone along with heating pad.  She is a little worried about a blood clot.  She has enough leg pain discomfort  and swelling that it is reasonable to see with her vascular ultrasound to rule out DVT.    PDMP not reviewed this encounter. No orders of the defined types were placed in this encounter.  Meds ordered this encounter  Medications   tiZANidine (ZANAFLEX) 4 MG tablet    Sig: Take 0.5-1 tablets (2-4 mg total) by mouth every 8 (eight) hours as needed.    Dispense:  30 tablet    Refill:  1   predniSONE (DELTASONE) 50 MG tablet    Sig: Take 1 tablet (50 mg total) by mouth daily.    Dispense:  5 tablet     Refill:  0     Discussed warning signs or symptoms. Please see discharge instructions. Patient expresses understanding.   The above documentation has been reviewed and is accurate and complete [name], M.D.

## 2023-03-05 NOTE — Patient Instructions (Addendum)
Thank you for coming in today.   You have a Vascular Ultrasound scheduled for:  [facility]: tomorrow (Friday) at 11:00 [address] [place], [place] [address] Phone: [phone]

## 2023-03-06 ENCOUNTER — Ambulatory Visit (HOSPITAL_COMMUNITY)
Admission: RE | Admit: 2023-03-06 | Discharge: 2023-03-06 | Disposition: A | Discharge status: Home / Self Care | Payer: Medicare HMO | Source: Ambulatory Visit | Attending: Cardiology | Admitting: Cardiology

## 2023-03-06 DIAGNOSIS — M79661 Pain in right lower leg: Secondary | ICD-10-CM | POA: Insufficient documentation

## 2023-03-06 DIAGNOSIS — R6889 Other general symptoms and signs: Secondary | ICD-10-CM | POA: Diagnosis not present

## 2023-03-06 DIAGNOSIS — M7989 Other specified soft tissue disorders: Secondary | ICD-10-CM | POA: Diagnosis not present

## 2023-03-09 NOTE — Progress Notes (Signed)
DVT ultrasound shows no DVT.

## 2023-03-12 ENCOUNTER — Ambulatory Visit: Payer: Commercial Managed Care - HMO

## 2023-03-17 DIAGNOSIS — H17822 Peripheral opacity of cornea, left eye: Secondary | ICD-10-CM | POA: Diagnosis not present

## 2023-03-17 DIAGNOSIS — H2513 Age-related nuclear cataract, bilateral: Secondary | ICD-10-CM | POA: Diagnosis not present

## 2023-03-17 DIAGNOSIS — H40053 Ocular hypertension, bilateral: Secondary | ICD-10-CM | POA: Diagnosis not present

## 2023-03-17 DIAGNOSIS — H0102B Squamous blepharitis left eye, upper and lower eyelids: Secondary | ICD-10-CM | POA: Diagnosis not present

## 2023-03-17 DIAGNOSIS — H0102A Squamous blepharitis right eye, upper and lower eyelids: Secondary | ICD-10-CM | POA: Diagnosis not present

## 2023-03-17 DIAGNOSIS — H3552 Pigmentary retinal dystrophy: Secondary | ICD-10-CM | POA: Diagnosis not present

## 2023-03-20 DIAGNOSIS — H524 Presbyopia: Secondary | ICD-10-CM | POA: Diagnosis not present

## 2023-03-20 DIAGNOSIS — H52209 Unspecified astigmatism, unspecified eye: Secondary | ICD-10-CM | POA: Diagnosis not present

## 2023-03-20 DIAGNOSIS — H5213 Myopia, bilateral: Secondary | ICD-10-CM | POA: Diagnosis not present

## 2023-03-24 ENCOUNTER — Ambulatory Visit (INDEPENDENT_AMBULATORY_CARE_PROVIDER_SITE_OTHER): Payer: Medicare HMO | Admitting: Family Medicine

## 2023-03-24 ENCOUNTER — Encounter: Payer: Self-pay | Admitting: Family Medicine

## 2023-03-24 VITALS — BP 120/80 | HR 90 | Temp 98.5°F | Resp 16 | Ht 59.0 in | Wt 219.0 lb

## 2023-03-24 DIAGNOSIS — F419 Anxiety disorder, unspecified: Secondary | ICD-10-CM

## 2023-03-24 DIAGNOSIS — E785 Hyperlipidemia, unspecified: Secondary | ICD-10-CM

## 2023-03-24 DIAGNOSIS — E559 Vitamin D deficiency, unspecified: Secondary | ICD-10-CM

## 2023-03-24 DIAGNOSIS — Z0000 Encounter for general adult medical examination without abnormal findings: Secondary | ICD-10-CM | POA: Diagnosis not present

## 2023-03-24 DIAGNOSIS — Z6841 Body Mass Index (BMI) 40.0 and over, adult: Secondary | ICD-10-CM | POA: Diagnosis not present

## 2023-03-24 DIAGNOSIS — R7303 Prediabetes: Secondary | ICD-10-CM

## 2023-03-24 DIAGNOSIS — Z1211 Encounter for screening for malignant neoplasm of colon: Secondary | ICD-10-CM

## 2023-03-24 DIAGNOSIS — E782 Mixed hyperlipidemia: Secondary | ICD-10-CM

## 2023-03-24 DIAGNOSIS — I700 Atherosclerosis of aorta: Secondary | ICD-10-CM

## 2023-03-24 DIAGNOSIS — R6889 Other general symptoms and signs: Secondary | ICD-10-CM | POA: Diagnosis not present

## 2023-03-24 LAB — COMPREHENSIVE METABOLIC PANEL
ALT: 25 U/L (ref 0–35)
AST: 24 U/L (ref 0–37)
Albumin: 4.4 g/dL (ref 3.5–5.2)
Alkaline Phosphatase: 83 U/L (ref 39–117)
BUN: 13 mg/dL (ref 6–23)
CO2: 28 mEq/L (ref 19–32)
Calcium: 10 mg/dL (ref 8.4–10.5)
Chloride: 103 mEq/L (ref 96–112)
Creatinine, Ser: 0.75 mg/dL (ref 0.40–1.20)
GFR: 85.34 mL/min (ref >60.00)
Glucose, Bld: 127 mg/dL — ABNORMAL HIGH (ref 70–99)
Potassium: 3.4 mEq/L — ABNORMAL LOW (ref 3.5–5.1)
Sodium: 140 mEq/L (ref 135–145)
Total Bilirubin: 0.6 mg/dL (ref 0.2–1.2)
Total Protein: 7.6 g/dL (ref 6.0–8.3)

## 2023-03-24 LAB — MICROALBUMIN / CREATININE URINE RATIO
Creatinine,U: 46.5 mg/dL
Microalb Creat Ratio: 1.5 mg/g (ref 0.0–30.0)
Microalb, Ur: <0.7 mg/dL (ref 0.0–1.9)

## 2023-03-24 LAB — HEMOGLOBIN A1C: Hgb A1c MFr Bld: 6.4 % (ref 4.6–6.5)

## 2023-03-24 LAB — LIPID PANEL
Cholesterol: 199 mg/dL (ref 0–200)
HDL: 64.9 mg/dL (ref >39.00)
LDL Cholesterol: 108 mg/dL — ABNORMAL HIGH (ref 0–99)
NonHDL: 134.09
Total CHOL/HDL Ratio: 3
Triglycerides: 129 mg/dL (ref 0.0–149.0)
VLDL: 25.8 mg/dL (ref 0.0–40.0)

## 2023-03-24 LAB — VITAMIN D 25 HYDROXY (VIT D DEFICIENCY, FRACTURES): VITD: 20.37 ng/mL — ABNORMAL LOW (ref 30.00–100.00)

## 2023-03-24 MED ORDER — HYDROXYZINE HCL 25 MG PO TABS
12.5000 mg | ORAL_TABLET | Freq: Two times a day (BID) | ORAL | 1 refills | Status: DC | PRN
Start: 2023-03-24 — End: 2024-01-26

## 2023-03-24 NOTE — Patient Instructions (Addendum)
A few things to remember from today's visit:  Prediabetes - Plan: Hemoglobin A1c, Comprehensive metabolic panel, Microalbumin/Creatinine Ratio, Urine  Hyperlipidemia, unspecified hyperlipidemia type  Aortic atherosclerosis (HCC)  Vitamin D deficiency, unspecified - Plan: VITAMIN D 25 Hydroxy (Vit-D Deficiency, Fractures), Comprehensive metabolic panel  Routine general medical examination at a health care facility  Anxiety disorder, unspecified type - Plan: hydrOXYzine (ATARAX) 25 MG tablet  Colon cancer screening - Plan: Ambulatory referral to Gastroenterology  Mixed hyperlipidemia - Plan: Lipid panel, Comprehensive metabolic panel  Aortic atherosclerosis (HCC), Chronic - Plan: Lipid panel, Comprehensive metabolic panel  Please be sure to arrange appt with gynecologist, if you need a referral let me know.  If you need refills for medications you take chronically, please call your pharmacy. Do not use My Chart to request refills or for acute issues that need immediate attention. If you send a my chart message, it may take a few days to be addressed, specially if I am not in the office.  Please be sure medication list is accurate. If a new problem present, please set up appointment sooner than planned today.  Health Maintenance, Female Adopting a healthy lifestyle and getting preventive care are important in promoting health and wellness. Ask your health care provider about: The right schedule for you to have regular tests and exams. Things you can do on your own to prevent diseases and keep yourself healthy. What should I know about diet, weight, and exercise? Eat a healthy diet  Eat a diet that includes plenty of vegetables, fruits, low-fat dairy products, and lean protein. Do not eat a lot of foods that are high in solid fats, added sugars, or sodium. Maintain a healthy weight Body mass index (BMI) is used to identify weight problems. It estimates body fat based on height and  weight. Your health care provider can help determine your BMI and help you achieve or maintain a healthy weight. Get regular exercise Get regular exercise. This is one of the most important things you can do for your health. Most adults should: Exercise for at least 150 minutes each week. The exercise should increase your heart rate and make you sweat (moderate-intensity exercise). Do strengthening exercises at least twice a week. This is in addition to the moderate-intensity exercise. Spend less time sitting. Even light physical activity can be beneficial. Watch cholesterol and blood lipids Have your blood tested for lipids and cholesterol at 62 years of age, then have this test every 5 years. Have your cholesterol levels checked more often if: Your lipid or cholesterol levels are high. You are older than 62 years of age. You are at high risk for heart disease. What should I know about cancer screening? Depending on your health history and family history, you may need to have cancer screening at various ages. This may include screening for: Breast cancer. Cervical cancer. Colorectal cancer. Skin cancer. Lung cancer. What should I know about heart disease, diabetes, and high blood pressure? Blood pressure and heart disease High blood pressure causes heart disease and increases the risk of stroke. This is more likely to develop in people who have high blood pressure readings or are overweight. Have your blood pressure checked: Every 3-5 years if you are 20-72 years of age. Every year if you are 67 years old or older. Diabetes Have regular diabetes screenings. This checks your fasting blood sugar level. Have the screening done: Once every three years after age 43 if you are at a normal weight and have  a low risk for diabetes. More often and at a younger age if you are overweight or have a high risk for diabetes. What should I know about preventing infection? Hepatitis B If you have a  higher risk for hepatitis B, you should be screened for this virus. Talk with your health care provider to find out if you are at risk for hepatitis B infection. Hepatitis C Testing is recommended for: Everyone born from [birth year] through 1965. Anyone with known risk factors for hepatitis C. Sexually transmitted infections (STIs) Get screened for STIs, including gonorrhea and chlamydia, if: You are sexually active and are younger than 62 years of age. You are older than 62 years of age and your health care provider tells you that you are at risk for this type of infection. Your sexual activity has changed since you were last screened, and you are at increased risk for chlamydia or gonorrhea. Ask your health care provider if you are at risk. Ask your health care provider about whether you are at high risk for HIV. Your health care provider may recommend a prescription medicine to help prevent HIV infection. If you choose to take medicine to prevent HIV, you should first get tested for HIV. You should then be tested every 3 months for as long as you are taking the medicine. Pregnancy If you are about to stop having your period (premenopausal) and you may become pregnant, seek counseling before you get pregnant. Take 400 to 800 micrograms (mcg) of folic acid every day if you become pregnant. Ask for birth control (contraception) if you want to prevent pregnancy. Osteoporosis and menopause Osteoporosis is a disease in which the bones lose minerals and strength with aging. This can result in bone fractures. If you are 61 years old or older, or if you are at risk for osteoporosis and fractures, ask your health care provider if you should: Be screened for bone loss. Take a calcium or vitamin D supplement to lower your risk of fractures. Be given hormone replacement therapy (HRT) to treat symptoms of menopause. Follow these instructions at home: Alcohol use Do not drink alcohol if: Your health care  provider tells you not to drink. You are pregnant, may be pregnant, or are planning to become pregnant. If you drink alcohol: Limit how much you have to: 0-1 drink a day. Know how much alcohol is in your drink. In the [place] one drink equals one 12 oz bottle of beer (355 mL), one 5 oz glass of wine (148 mL), or one 1 oz glass of hard liquor (44 mL). Lifestyle Do not use any products that contain nicotine or tobacco. These products include cigarettes, chewing tobacco, and vaping devices, such as e-cigarettes. If you need help quitting, ask your health care provider. Do not use street drugs. Do not share needles. Ask your health care provider for help if you need support or information about quitting drugs. General instructions Schedule regular health, dental, and eye exams. Stay current with your vaccines. Tell your health care provider if: You often feel depressed. You have ever been abused or do not feel safe at home. Summary Adopting a healthy lifestyle and getting preventive care are important in promoting health and wellness. Follow your health care provider's instructions about healthy diet, exercising, and getting tested or screened for diseases. Follow your health care provider's instructions on monitoring your cholesterol and blood pressure. This information is not intended to replace advice given to you by your health care provider. Make sure  you discuss any questions you have with your health care provider. Document Revised: 11/05/2020 Document Reviewed: 11/05/2020 Elsevier Patient Education  2024 [employer].
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
## 2023-03-26 MED ORDER — ROSUVASTATIN CALCIUM 10 MG PO TABS: 10.0000 mg | ORAL_TABLET | Freq: Every day | ORAL | 3 refills | Status: DC

## 2023-03-26 NOTE — Assessment & Plan Note (Signed)
She is not on vitamin D supplementation. 25 OH vitamin D ordered today.

## 2023-03-26 NOTE — Assessment & Plan Note (Addendum)
Currently on Rosuvastatin 10 mg daily. Further recommendations according to FLP result.

## 2023-03-26 NOTE — Assessment & Plan Note (Addendum)
Continue Rosuvastatin 10 mg daily. Low fat diet also recommended. Further recommendations according to FLP results.

## 2023-03-26 NOTE — Assessment & Plan Note (Signed)
Stopped Celexa 10 mg. She feels like problem has improved. Continue Hydroxyzine 25 mg 1/2 tab daily as needed.

## 2023-03-26 NOTE — Assessment & Plan Note (Signed)
HgA1C 6.2 in 05/2022. Encouraged consistency with a healthy life style for diabetes prevention. Further recommendations according to HgA1C results.

## 2023-03-31 ENCOUNTER — Ambulatory Visit
Admission: RE | Admit: 2023-03-31 | Discharge: 2023-03-31 | Disposition: A | Discharge status: Home / Self Care | Payer: Medicare HMO | Source: Ambulatory Visit | Attending: Family Medicine | Admitting: Family Medicine

## 2023-03-31 DIAGNOSIS — Z1231 Encounter for screening mammogram for malignant neoplasm of breast: Secondary | ICD-10-CM | POA: Diagnosis not present

## 2023-03-31 DIAGNOSIS — R6889 Other general symptoms and signs: Secondary | ICD-10-CM | POA: Diagnosis not present

## 2023-04-28 ENCOUNTER — Encounter: Payer: Self-pay | Admitting: Gastroenterology

## 2023-04-28 ENCOUNTER — Telehealth: Payer: Self-pay | Admitting: Family Medicine

## 2023-04-28 DIAGNOSIS — Z01419 Encounter for gynecological examination (general) (routine) without abnormal findings: Secondary | ICD-10-CM

## 2023-04-28 NOTE — Telephone Encounter (Signed)
Referral entered  

## 2023-04-28 NOTE — Telephone Encounter (Signed)
Pt states she advised to call with info for gyn she wants to see. Requesting referral to Gyn Dr Scherrie Bateman  [phone] or [phone]

## 2023-05-27 ENCOUNTER — Encounter: Payer: Self-pay | Admitting: Gastroenterology

## 2023-05-27 ENCOUNTER — Ambulatory Visit: Payer: Medicare HMO

## 2023-05-27 VITALS — Ht 59.0 in | Wt 219.0 lb

## 2023-05-27 DIAGNOSIS — Z1211 Encounter for screening for malignant neoplasm of colon: Secondary | ICD-10-CM

## 2023-05-27 MED ORDER — NA SULFATE-K SULFATE-MG SULF 17.5-3.13-1.6 GM/177ML PO SOLN
1.0000 | Freq: Once | ORAL | 0 refills | Status: AC
Start: 2023-05-27 — End: 2023-05-27

## 2023-05-27 NOTE — Progress Notes (Signed)
No egg or soy allergy known to patient  No issues known to pt with past sedation with any surgeries or procedures Patient denies ever being told they had issues or difficulty with intubation  No FH of Malignant Hyperthermia Pt is not on diet pills Pt is not on  home 02  Pt is not on blood thinners  Pt denies issues with constipation  No A fib or A flutter Have any cardiac testing pending-- no  LOA: independent with cane  Prep: suprep   Patient's chart reviewed by Cathlyn Parsons CNRA prior to previsit and patient appropriate for the [facility].  Previsit completed and red dot placed by patient's name on their procedure day (on provider's schedule).     PV competed with patient. Prep instructions sent via mychart and home address. Goodrx coupon for [facility] provided to use for price reduction if needed.

## 2023-06-11 ENCOUNTER — Encounter: Payer: Self-pay | Admitting: Gastroenterology

## 2023-06-11 ENCOUNTER — Ambulatory Visit: Payer: Medicare HMO | Admitting: Gastroenterology

## 2023-06-11 VITALS — BP 137/78 | HR 78 | Temp 97.4°F | Resp 16 | Ht 59.0 in | Wt 212.0 lb

## 2023-06-11 DIAGNOSIS — D120 Benign neoplasm of cecum: Secondary | ICD-10-CM

## 2023-06-11 DIAGNOSIS — Z1211 Encounter for screening for malignant neoplasm of colon: Secondary | ICD-10-CM

## 2023-06-11 DIAGNOSIS — K635 Polyp of colon: Secondary | ICD-10-CM

## 2023-06-11 DIAGNOSIS — K5730 Diverticulosis of large intestine without perforation or abscess without bleeding: Secondary | ICD-10-CM | POA: Diagnosis not present

## 2023-06-11 DIAGNOSIS — E669 Obesity, unspecified: Secondary | ICD-10-CM | POA: Diagnosis not present

## 2023-06-11 DIAGNOSIS — K621 Rectal polyp: Secondary | ICD-10-CM

## 2023-06-11 MED ORDER — SODIUM CHLORIDE 0.9 % IV SOLN: 500.0000 mL | INTRAVENOUS | Status: DC

## 2023-06-11 NOTE — Op Note (Signed)
[facility] Patient Name: Tammy Tucker Procedure Date: 06/11/2023 10:54 AM MRN: [id number] Endoscopist: Lorin Picket E. Tomasa Rand , MD, [id number] Age: 63 Referring MD:  Date of Birth: [birth year] Gender: Female Account #: [id number] Procedure:                Colonoscopy Indications:              Screening for colorectal malignant neoplasm (last                            colonoscopy was 10 years ago) Medicines:                Monitored Anesthesia Care Procedure:                Pre-Anesthesia Assessment:                           - Prior to the procedure, a History and Physical                            was performed, and patient medications and                            allergies were reviewed. The patient's tolerance of                            previous anesthesia was also reviewed. The risks                            and benefits of the procedure and the sedation                            options and risks were discussed with the patient.                            All questions were answered, and informed consent                            was obtained. Prior Anticoagulants: The patient has                            taken no anticoagulant or antiplatelet agents. ASA                            Grade Assessment: III - A patient with severe                            systemic disease. After reviewing the risks and                            benefits, the patient was deemed in satisfactory                            condition to undergo the procedure.  After obtaining informed consent, the colonoscope                            was passed under direct vision. Throughout the                            procedure, the patient's blood pressure, pulse, and                            oxygen saturations were monitored continuously. The                            CF [id number] #[id number] was introduced through the anus                            and advanced to the  the cecum, identified by                            appendiceal orifice and ileocecal valve. The                            colonoscopy was performed without difficulty. The                            patient tolerated the procedure well. The quality                            of the bowel preparation was adequate. The                            ileocecal valve, appendiceal orifice, and rectum                            were photographed. The bowel preparation used was                            SUPREP via split dose instruction. Scope In: 11:07:46 AM Scope Out: 11:26:04 AM Scope Withdrawal Time: 0 hours 13 minutes 33 seconds  Total Procedure Duration: 0 hours 18 minutes 18 seconds  Findings:                 The perianal and digital rectal examinations were                            normal. Pertinent negatives include normal                            sphincter tone and no palpable rectal lesions.                           Two flat polyps were found in the cecum. The polyps                            were 2 to 4 mm in size. These  polyps were removed                            with a cold snare. Resection and retrieval were                            complete. Estimated blood loss was minimal.                           Multiple small-mouthed diverticula were found in                            the sigmoid colon and descending colon.                           The exam was otherwise normal throughout the                            examined colon.                           The retroflexed view of the distal rectum and anal                            verge was normal and showed no anal or rectal                            abnormalities.                           Many (>10) hyperplastic polyps were found in the                            rectum. The polyps were diminutive in size. Complications:            No immediate complications. Estimated Blood Loss:     Estimated blood loss was  minimal. Impression:               - Two 2 to 4 mm polyps in the cecum, removed with a                            cold snare. Resected and retrieved.                           - Mild diverticulosis in the sigmoid colon and in                            the descending colon.                           - The distal rectum and anal verge are normal on                            retroflexion view.                           -  Many diminutive polyps in the rectum, consistent                            with hyperplastic polyps Recommendation:           - Patient has a contact number available for                            emergencies. The signs and symptoms of potential                            delayed complications were discussed with the                            patient. Return to normal activities tomorrow.                            Written discharge instructions were provided to the                            patient.                           - Resume previous diet.                           - Continue present medications.                           - Await pathology results.                           - Repeat colonoscopy (date not yet determined) for                            surveillance based on pathology results. Joffrey Kerce E. Tomasa Rand, MD 06/11/2023 11:33:07 AM This report has been signed electronically.

## 2023-06-11 NOTE — Patient Instructions (Signed)
Handouts Provided:  Diverticulosis and Polyps  YOU HAD AN ENDOSCOPIC PROCEDURE TODAY AT THE [facility]:   Refer to the procedure report that was given to you for any specific questions about what was found during the examination.  If the procedure report does not answer your questions, please call your gastroenterologist to clarify.  If you requested that your care partner not be given the details of your procedure findings, then the procedure report has been included in a sealed envelope for you to review at your convenience later.  YOU SHOULD EXPECT: Some feelings of bloating in the abdomen. Passage of more gas than usual.  Walking can help get rid of the air that was put into your GI tract during the procedure and reduce the bloating. If you had a lower endoscopy (such as a colonoscopy or flexible sigmoidoscopy) you may notice spotting of blood in your stool or on the toilet paper. If you underwent a bowel prep for your procedure, you may not have a normal bowel movement for a few days.  Please Note:  You might notice some irritation and congestion in your nose or some drainage.  This is from the oxygen used during your procedure.  There is no need for concern and it should clear up in a day or so.  SYMPTOMS TO REPORT IMMEDIATELY:  Following lower endoscopy (colonoscopy or flexible sigmoidoscopy):  Excessive amounts of blood in the stool  Significant tenderness or worsening of abdominal pains  Swelling of the abdomen that is new, acute  Fever of 100F or higher  For urgent or emergent issues, a gastroenterologist can be reached at any hour by calling [phone]. Do not use MyChart messaging for urgent concerns.    DIET:  We do recommend a small meal at first, but then you may proceed to your regular diet.  Drink plenty of fluids but you should avoid alcoholic beverages for 24 hours.  ACTIVITY:  You should plan to take it easy for the rest of today and you should NOT DRIVE  or use heavy machinery until tomorrow (because of the sedation medicines used during the test).    FOLLOW UP: Our staff will call the number listed on your records the next business day following your procedure.  We will call around 7:15- 8:00 am to check on you and address any questions or concerns that you may have regarding the information given to you following your procedure. If we do not reach you, we will leave a message.     If any biopsies were taken you will be contacted by phone or by letter within the next 1-3 weeks.  Please call us at [phone] if you have not heard about the biopsies in 3 weeks.    SIGNATURES/CONFIDENTIALITY: You and/or your care partner have signed paperwork which will be entered into your electronic medical record.  These signatures attest to the fact that that the information above on your After Visit Summary has been reviewed and is understood.  Full responsibility of the confidentiality of this discharge information lies with you and/or your care-partner.  

## 2023-06-11 NOTE — Progress Notes (Signed)
Pt's states no medical or surgical changes since previsit or office visit. 

## 2023-06-11 NOTE — Progress Notes (Signed)
[facility] History and Physical   Primary Care Physician:  [place], Betty G, MD   Reason for Procedure:   Colon cancer screening  Plan:    Screening colonoscopy     HPI: Tammy Tucker is a 62 y.o. female undergoing initial average risk screening colonoscopy.  She has no family history of colon cancer and no chronic GI symptoms. She had a colonoscopy in Dec 2014 in which non-adenomatous polyps were removed.   Past Medical History:  Diagnosis Date   Fibroids    Glaucoma    Obesity    Retinal pigmentation    Vitamin D deficiency     Past Surgical History:  Procedure Laterality Date   BREAST BIOPSY  1977   benign   CESAREAN SECTION  1994, 1990   ORIF CLAVICULAR FRACTURE Right 09/08/2020   Procedure: OPEN REDUCTION INTERNAL FIXATION (ORIF) CLAVICULAR FRACTURE;  Surgeon: Bjorn Pippin, MD;  Location: [facility] OR;  Service: Orthopedics;  Laterality: Right;   PERCUTANEOUS PINNING Right 09/09/2020   Procedure: PERCUTANEOUS PINNING METACARPAL;  Surgeon: Teryl Lucy, MD;  Location: [facility] OR;  Service: Orthopedics;  Laterality: Right;    Prior to Admission medications   Medication Sig Start Date End Date Taking? Authorizing Provider  hydrOXYzine (ATARAX) 25 MG tablet Take 0.5-1 tablets (12.5-25 mg total) by mouth 2 (two) times daily as needed for anxiety. 03/24/23  Yes [place], Betty G, MD  latanoprost (XALATAN) 0.005 % ophthalmic solution Place 1 drop into both eyes at bedtime. 08/25/20  Yes [provider]  Multiple Vitamin (MULTIVITAMIN) tablet Take 1 tablet by mouth daily.   Yes [provider]  rosuvastatin (CRESTOR) 10 MG tablet Take 1 tablet (10 mg total) by mouth daily. 03/26/23  Yes [place], Betty G, MD  AMBULATORY NON [facility] MEDICATION [place] Dispense [phone] Use as needed 11/28/21   Rodolph Bong, MD  raNITIdine HCl (ZANTAC PO) Take by mouth as needed (muscle relaxer).    [provider]    Current Outpatient Medications  Medication Sig  Dispense Refill   hydrOXYzine (ATARAX) 25 MG tablet Take 0.5-1 tablets (12.5-25 mg total) by mouth 2 (two) times daily as needed for anxiety. 60 tablet 1   latanoprost (XALATAN) 0.005 % ophthalmic solution Place 1 drop into both eyes at bedtime.     Multiple Vitamin (MULTIVITAMIN) tablet Take 1 tablet by mouth daily.     rosuvastatin (CRESTOR) 10 MG tablet Take 1 tablet (10 mg total) by mouth daily. 90 tablet 3   AMBULATORY NON FORMULARY MEDICATION Cane Dispense 1 M25.562 Use as needed 1 Product 0   raNITIdine HCl (ZANTAC PO) Take by mouth as needed (muscle relaxer).     Current Facility-Administered Medications  Medication Dose Route Frequency Provider Last Rate Last Admin   0.9 %  sodium chloride infusion  500 mL Intravenous Continuous Jenel Lucks, MD        Allergies as of 06/11/2023 - Review Complete 06/11/2023  Allergen Reaction Noted   Codeine Itching 03/19/2013   Tylenol with codeine #3 [acetaminophen-codeine] Itching 09/07/2020    Family History  Problem Relation Age of Onset   Colon polyps Mother    Diabetes Mother    Hearing loss Mother    Hypertension Mother    Alcohol abuse Father    Depression Father    Early death Father    Diabetes Sister    Asthma Sister    Asthma Sister    Depression Sister    Asthma Sister  Depression Sister    Hypertension Sister    Bipolar disorder Son    Rectal cancer Neg Hx    Stomach cancer Neg Hx    Colon cancer Neg Hx    Esophageal cancer Neg Hx     Social History   Socioeconomic History   Marital status: Married    Spouse name: Not on file   Number of children: 3   Years of education: Not on file   Highest education level: Not on file  Occupational History   Not on file  Tobacco Use   Smoking status: Never   Smokeless tobacco: Never  Vaping Use   Vaping status: Not on file  Substance and Sexual Activity   Alcohol use: Never   Drug use: Never   Sexual activity: Yes    Partners: Male  Other Topics  Concern   Not on file  Social History Narrative   ** Merged History Encounter **       Social Drivers of Corporate investment banker Strain: Not on file  Food Insecurity: Not on file  Transportation Needs: Not on file  Physical Activity: Not on file  Stress: Not on file  Social Connections: Not on file  Intimate Partner Violence: Not on file    Review of Systems:  All other review of systems negative except as mentioned in the HPI.  Physical Exam: Vital signs BP (!) 147/81   Pulse 92   Temp (!) 97.4 F (36.3 C)   Ht 4\' 11"  (1.499 m)   Wt 212 lb (96.2 kg)   LMP 06/01/2013   SpO2 97%   BMI 42.82 kg/m   General:   Alert,  Well-developed, well-nourished, pleasant and cooperative in NAD Airway:  Mallampati 2 Lungs:  Clear throughout to auscultation.   Heart:  Regular rate and rhythm; no murmurs, clicks, rubs,  or gallops. Abdomen:  Soft, nontender and nondistended. Normal bowel sounds.   Neuro/Psych:  Normal mood and affect. A and O x 3   Gor Vestal E. Tomasa Rand, MD [facility]

## 2023-06-11 NOTE — Progress Notes (Signed)
A/O x 3, gd SR's, VSS, report to RN

## 2023-06-11 NOTE — Progress Notes (Signed)
Called to room to assist during endoscopic procedure.  Patient ID and intended procedure confirmed with present staff. Received instructions for my participation in the procedure from the performing physician.  

## 2023-06-12 ENCOUNTER — Telehealth: Payer: Self-pay | Admitting: *Deleted

## 2023-06-12 NOTE — Telephone Encounter (Signed)
Attempted to call for follow up call. Line busy. Unable to leave message.

## 2023-06-17 LAB — SURGICAL PATHOLOGY

## 2023-06-18 NOTE — Progress Notes (Signed)
Tammy Tucker,  One polyp which I removed during your recent procedure was proven to be completely benign but is considered a "pre-cancerous" polyp that MAY have grown into cancer if it had not been removed.  The other polyp was not precancerous.  Studies shows that at least 20% of women over age 62 and 30% of men over age 65 have pre-cancerous polyps.  Based on current nationally recognized surveillance guidelines, I recommend that you have a repeat colonoscopy in 7 years.   If you develop any new rectal bleeding, abdominal pain or significant bowel habit changes, please contact me before then.

## 2023-07-02 ENCOUNTER — Encounter: Payer: Self-pay | Admitting: Obstetrics and Gynecology

## 2023-07-02 ENCOUNTER — Ambulatory Visit: Payer: Medicare HMO | Admitting: Obstetrics and Gynecology

## 2023-07-02 ENCOUNTER — Other Ambulatory Visit (HOSPITAL_COMMUNITY)
Admission: RE | Admit: 2023-07-02 | Discharge: 2023-07-02 | Disposition: A | Discharge status: Home / Self Care | Payer: Medicare HMO | Source: Ambulatory Visit | Attending: Obstetrics and Gynecology | Admitting: Obstetrics and Gynecology

## 2023-07-02 VITALS — BP 138/72 | HR 104 | Ht 59.0 in | Wt 213.0 lb

## 2023-07-02 DIAGNOSIS — B009 Herpesviral infection, unspecified: Secondary | ICD-10-CM | POA: Diagnosis not present

## 2023-07-02 DIAGNOSIS — Z113 Encounter for screening for infections with a predominantly sexual mode of transmission: Secondary | ICD-10-CM

## 2023-07-02 DIAGNOSIS — Z124 Encounter for screening for malignant neoplasm of cervix: Secondary | ICD-10-CM | POA: Diagnosis not present

## 2023-07-02 DIAGNOSIS — Z1331 Encounter for screening for depression: Secondary | ICD-10-CM

## 2023-07-02 DIAGNOSIS — B372 Candidiasis of skin and nail: Secondary | ICD-10-CM | POA: Diagnosis not present

## 2023-07-02 DIAGNOSIS — Z9189 Other specified personal risk factors, not elsewhere classified: Secondary | ICD-10-CM | POA: Diagnosis not present

## 2023-07-02 DIAGNOSIS — Z01419 Encounter for gynecological examination (general) (routine) without abnormal findings: Secondary | ICD-10-CM | POA: Insufficient documentation

## 2023-07-02 MED ORDER — NYSTATIN 100000 UNIT/GM EX POWD: 1.0000 | Freq: Two times a day (BID) | CUTANEOUS | 1 refills | Status: AC
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
## 2023-07-02 NOTE — Assessment & Plan Note (Signed)
 Cervical cancer screening performed according to ASCCP guidelines. Encouraged annual mammogram screening Colonoscopy UTD DXA: has RFs, vit D deficiency, menopause and limited ambulation but declined Labs and immunizations with her primary Encouraged safe sexual practices as indicated Encouraged healthy lifestyle practices with diet and exercise For patients under 50-63yo, I recommend 1200mg  calcium  daily and 600IU of vitamin D  daily.

## 2023-07-02 NOTE — Patient Instructions (Signed)
 For patients under 50-63yo, I recommend 1200mg  calcium  daily and 600IU of vitamin D daily. For patients over 63yo, I recommend 1200mg  calcium  daily and 800IU of vitamin D daily.  Health Maintenance, Female Adopting a healthy lifestyle and getting preventive care are important in promoting health and wellness. Ask your health care provider about: The right schedule for you to have regular tests and exams. Things you can do on your own to prevent diseases and keep yourself healthy. What should I know about diet, weight, and exercise? Eat a healthy diet  Eat a diet that includes plenty of vegetables, fruits, low-fat dairy products, and lean protein. Do not eat a lot of foods that are high in solid fats, added sugars, or sodium. Maintain a healthy weight Body mass index (BMI) is used to identify weight problems. It estimates body fat based on height and weight. Your health care provider can help determine your BMI and help you achieve or maintain a healthy weight. Get regular exercise Get regular exercise. This is one of the most important things you can do for your health. Most adults should: Exercise for at least 150 minutes each week. The exercise should increase your heart rate and make you sweat (moderate-intensity exercise). Do strengthening exercises at least twice a week. This is in addition to the moderate-intensity exercise. Spend less time sitting. Even light physical activity can be beneficial. Watch cholesterol and blood lipids Have your blood tested for lipids and cholesterol at 63 years of age, then have this test every 5 years. Have your cholesterol levels checked more often if: Your lipid or cholesterol levels are high. You are older than 63 years of age. You are at high risk for heart disease. What should I know about cancer screening? Depending on your health history and family history, you may need to have cancer screening at various ages. This may include screening  for: Breast cancer. Cervical cancer. Colorectal cancer. Skin cancer. Lung cancer. What should I know about heart disease, diabetes, and high blood pressure? Blood pressure and heart disease High blood pressure causes heart disease and increases the risk of stroke. This is more likely to develop in people who have high blood pressure readings or are overweight. Have your blood pressure checked: Every 3-5 years if you are 25-57 years of age. Every year if you are 24 years old or older. Diabetes Have regular diabetes screenings. This checks your fasting blood sugar level. Have the screening done: Once every three years after age 62 if you are at a normal weight and have a low risk for diabetes. More often and at a younger age if you are overweight or have a high risk for diabetes. What should I know about preventing infection? Hepatitis B If you have a higher risk for hepatitis B, you should be screened for this virus. Talk with your health care provider to find out if you are at risk for hepatitis B infection. Hepatitis C Testing is recommended for: Everyone born from [birth year] through 1965. Anyone with known risk factors for hepatitis C. Sexually transmitted infections (STIs) Get screened for STIs, including gonorrhea and chlamydia, if: You are sexually active and are younger than 63 years of age. You are older than 63 years of age and your health care provider tells you that you are at risk for this type of infection. Your sexual activity has changed since you were last screened, and you are at increased risk for chlamydia or gonorrhea. Ask your health care provider if  you are at risk. Ask your health care provider about whether you are at high risk for HIV. Your health care provider may recommend a prescription medicine to help prevent HIV infection. If you choose to take medicine to prevent HIV, you should first get tested for HIV. You should then be tested every 3 months for as long as you  are taking the medicine. Osteoporosis and menopause Osteoporosis is a disease in which the bones lose minerals and strength with aging. This can result in bone fractures. If you are 72 years old or older, or if you are at risk for osteoporosis and fractures, ask your health care provider if you should: Be screened for bone loss. Take a calcium  or vitamin D supplement to lower your risk of fractures. Be given hormone replacement therapy (HRT) to treat symptoms of menopause. Follow these instructions at home: Alcohol use Do not drink alcohol if: Your health care provider tells you not to drink. You are pregnant, may be pregnant, or are planning to become pregnant. If you drink alcohol: Limit how much you have to: 0-1 drink a day. Know how much alcohol is in your drink. In the [place] one drink equals one 12 oz bottle of beer (355 mL), one 5 oz glass of wine (148 mL), or one 1 oz glass of hard liquor (44 mL). Lifestyle Do not use any products that contain nicotine or tobacco. These products include cigarettes, chewing tobacco, and vaping devices, such as e-cigarettes. If you need help quitting, ask your health care provider. Do not use street drugs. Do not share needles. Ask your health care provider for help if you need support or information about quitting drugs. General instructions Schedule regular health, dental, and eye exams. Stay current with your vaccines. Tell your health care provider if: You often feel depressed. You have ever been abused or do not feel safe at home. Summary Adopting a healthy lifestyle and getting preventive care are important in promoting health and wellness. Follow your health care provider's instructions about healthy diet, exercising, and getting tested or screened for diseases. Follow your health care provider's instructions on monitoring your cholesterol and blood pressure. This information is not intended to replace advice given to you by your health  care provider. Make sure you discuss any questions you have with your health care provider. Document Revised: 11/05/2020 Document Reviewed: 11/05/2020 Elsevier Patient Education  2024 [employer].

## 2023-07-03 LAB — HEPATITIS C ANTIBODY: Hepatitis C Ab: NONREACTIVE

## 2023-07-03 LAB — HEPATITIS B SURFACE ANTIGEN: Hepatitis B Surface Ag: NONREACTIVE

## 2023-07-03 LAB — HSV(HERPES SIMPLEX VRS) I + II AB-IGG
HSV 1 IGG,TYPE SPECIFIC AB: 51.9 {index} — ABNORMAL HIGH
HSV 2 IGG,TYPE SPECIFIC AB: 4.34 {index} — ABNORMAL HIGH

## 2023-07-03 LAB — RPR: RPR Ser Ql: NONREACTIVE

## 2023-07-03 LAB — HIV ANTIBODY (ROUTINE TESTING W REFLEX): HIV 1&2 Ab, 4th Generation: NONREACTIVE

## 2023-07-06 ENCOUNTER — Encounter: Payer: Self-pay | Admitting: Obstetrics and Gynecology

## 2023-07-06 DIAGNOSIS — B009 Herpesviral infection, unspecified: Secondary | ICD-10-CM | POA: Insufficient documentation

## 2023-07-07 LAB — CYTOLOGY - PAP
Chlamydia: NEGATIVE
Comment: NEGATIVE
Comment: NEGATIVE
Comment: NORMAL
Diagnosis: NEGATIVE
Neisseria Gonorrhea: NEGATIVE
Trichomonas: NEGATIVE

## 2023-07-14 ENCOUNTER — Encounter: Payer: Self-pay | Admitting: Obstetrics and Gynecology

## 2023-09-07 ENCOUNTER — Encounter: Payer: Self-pay | Admitting: Family Medicine

## 2023-09-09 MED ORDER — PREDNISONE 50 MG PO TABS: 50.0000 mg | ORAL_TABLET | Freq: Every day | ORAL | 0 refills | Status: DC

## 2023-09-28 ENCOUNTER — Ambulatory Visit: Admitting: Family Medicine

## 2023-09-28 NOTE — Progress Notes (Deleted)
   Rubin Payor, PhD, LAT, ATC acting as a scribe for Tammy Graham, MD.  Tammy Tucker is a 63 y.o. female who presents to [facility] today for L leg pain. Pt was last seen by Dr. Denyse Amass on 03/05/23 for low back and R hip pain.  Today, pt c/o L hip/leg pain x ***. Pt locates pain to ***  Radiating pain: yes*** LE numbness/tingling: LE weakness: Aggravates: Treatments tried:  Dx imaging: 09/05/22 L hip & L-spine XR  Pertinent review of systems: ***  Relevant historical information: ***   Exam:  LMP 06/01/2013  General: Well Developed, well nourished, and in no acute distress.   MSK: ***    Lab and Radiology Results No results found for this or any previous visit (from the past 72 hours). No results found.     Assessment and Plan: 63 y.o. female with ***   PDMP not reviewed this encounter. No orders of the defined types were placed in this encounter.  No orders of the defined types were placed in this encounter.    Discussed warning signs or symptoms. Please see discharge instructions. Patient expresses understanding.   ***

## 2023-09-30 ENCOUNTER — Telehealth: Payer: Self-pay | Admitting: Family Medicine

## 2023-09-30 NOTE — Telephone Encounter (Signed)
 Lmom for pt to sch welcome to medicare appt with dr [place]

## 2023-10-01 ENCOUNTER — Ambulatory Visit: Admitting: Family Medicine

## 2023-10-01 NOTE — Progress Notes (Unsigned)
   Rubin Payor, PhD, LAT, ATC acting as a scribe for [name], MD.  Tammy Tucker is a 63 y.o. female who presents to [facility] at [facility] today for L leg pain. Pt was last seen by Dr. Denyse Amass on 03/05/23 for low back and R hip pain.  Today, pt c/o L hip/leg pain x ***. Pt locates pain to ***  Radiating pain: yes*** LE numbness/tingling: LE weakness: Aggravates: Treatments tried:  Dx imaging: 09/05/22 L hip & L-spine XR  Pertinent review of systems: ***  Relevant historical information: ***   Exam:  LMP 06/01/2013  General: Well Developed, well nourished, and in no acute distress.   MSK: ***    Lab and Radiology Results No results found for this or any previous visit (from the past 72 hours). No results found.     Assessment and Plan: 63 y.o. female with ***   PDMP not reviewed this encounter. No orders of the defined types were placed in this encounter.  No orders of the defined types were placed in this encounter.    Discussed warning signs or symptoms. Please see discharge instructions. Patient expresses understanding.   ***

## 2023-10-02 ENCOUNTER — Encounter: Payer: Self-pay | Admitting: Family Medicine

## 2023-10-02 ENCOUNTER — Ambulatory Visit: Admitting: Family Medicine

## 2023-10-02 ENCOUNTER — Other Ambulatory Visit: Payer: Self-pay

## 2023-10-02 ENCOUNTER — Ambulatory Visit (INDEPENDENT_AMBULATORY_CARE_PROVIDER_SITE_OTHER)

## 2023-10-02 VITALS — BP 138/88 | HR 89 | Ht 59.0 in | Wt 211.0 lb

## 2023-10-02 DIAGNOSIS — M25552 Pain in left hip: Secondary | ICD-10-CM | POA: Diagnosis not present

## 2023-10-02 DIAGNOSIS — G8929 Other chronic pain: Secondary | ICD-10-CM | POA: Diagnosis not present

## 2023-10-02 DIAGNOSIS — M5442 Lumbago with sciatica, left side: Secondary | ICD-10-CM | POA: Diagnosis not present

## 2023-10-02 DIAGNOSIS — M545 Low back pain, unspecified: Secondary | ICD-10-CM | POA: Diagnosis not present

## 2023-10-02 DIAGNOSIS — M47814 Spondylosis without myelopathy or radiculopathy, thoracic region: Secondary | ICD-10-CM | POA: Diagnosis not present

## 2023-10-02 MED ORDER — LORAZEPAM 0.5 MG PO TABS: ORAL_TABLET | ORAL | 0 refills | Status: DC

## 2023-10-02 MED ORDER — TIZANIDINE HCL 4 MG PO TABS: 4.0000 mg | ORAL_TABLET | Freq: Three times a day (TID) | ORAL | 1 refills | Status: DC | PRN

## 2023-10-02 MED ORDER — GABAPENTIN 300 MG PO CAPS: 300.0000 mg | ORAL_CAPSULE | Freq: Three times a day (TID) | ORAL | 3 refills | Status: DC | PRN

## 2023-10-02 NOTE — Patient Instructions (Addendum)
 Thank you for coming in today.   Parking Placard Application provided today.   Jury Duty excuse letter provided today.   Please get an Xray today before you leave   You should hear from MRI scheduling within 1 week. If you do not hear please let me know.

## 2023-10-05 ENCOUNTER — Encounter: Payer: Self-pay | Admitting: Family Medicine

## 2023-10-05 NOTE — Progress Notes (Signed)
 Low back x-ray shows a little bit of arthritis at the lower portion of the mid back and at the base of the spine.

## 2023-10-20 ENCOUNTER — Ambulatory Visit: Admitting: Family Medicine

## 2023-11-10 ENCOUNTER — Ambulatory Visit: Payer: Self-pay | Admitting: Family Medicine

## 2023-11-10 ENCOUNTER — Ambulatory Visit (INDEPENDENT_AMBULATORY_CARE_PROVIDER_SITE_OTHER): Admitting: Family Medicine

## 2023-11-10 ENCOUNTER — Encounter: Payer: Self-pay | Admitting: Family Medicine

## 2023-11-10 VITALS — BP 128/80 | HR 94 | Resp 16 | Ht 59.0 in | Wt 215.1 lb

## 2023-11-10 DIAGNOSIS — E559 Vitamin D deficiency, unspecified: Secondary | ICD-10-CM

## 2023-11-10 DIAGNOSIS — F419 Anxiety disorder, unspecified: Secondary | ICD-10-CM

## 2023-11-10 DIAGNOSIS — R7303 Prediabetes: Secondary | ICD-10-CM | POA: Diagnosis not present

## 2023-11-10 DIAGNOSIS — E785 Hyperlipidemia, unspecified: Secondary | ICD-10-CM

## 2023-11-10 DIAGNOSIS — E876 Hypokalemia: Secondary | ICD-10-CM | POA: Diagnosis not present

## 2023-11-10 LAB — BASIC METABOLIC PANEL WITH GFR
BUN: 9 mg/dL (ref 6–23)
CO2: 27 meq/L (ref 19–32)
Calcium: 9.8 mg/dL (ref 8.4–10.5)
Chloride: 106 meq/L (ref 96–112)
Creatinine, Ser: 0.69 mg/dL (ref 0.40–1.20)
GFR: 92.61 mL/min (ref >60.00)
Glucose, Bld: 100 mg/dL — ABNORMAL HIGH (ref 70–99)
Potassium: 3.7 meq/L (ref 3.5–5.1)
Sodium: 142 meq/L (ref 135–145)

## 2023-11-10 LAB — VITAMIN D 25 HYDROXY (VIT D DEFICIENCY, FRACTURES): VITD: 44.54 ng/mL (ref 30.00–100.00)

## 2023-11-10 LAB — HEMOGLOBIN A1C: Hgb A1c MFr Bld: 6.4 % (ref 4.6–6.5)

## 2023-11-10 MED ORDER — ROSUVASTATIN CALCIUM 10 MG PO TABS: 10.0000 mg | ORAL_TABLET | Freq: Every day | ORAL | 3 refills | Status: AC

## 2023-11-10 NOTE — Assessment & Plan Note (Addendum)
 Mild, K+ 3.4. Potassium rich diet recommended for now. Further recommendations according to BMP results.

## 2023-11-10 NOTE — Assessment & Plan Note (Signed)
 She is reporting improvement, has not needed Hydroxyzine  for months.

## 2023-11-10 NOTE — Patient Instructions (Addendum)
 A few things to remember from today's visit:  Prediabetes - Plan: Hemoglobin A1c  Anxiety disorder, unspecified type  Vitamin D  deficiency, unspecified - Plan: VITAMIN D  25 Hydroxy (Vit-D Deficiency, Fractures)  Hypokalemia - Plan: Basic metabolic panel with GFR  No changes today. Schedule a welcome to medicare visit in about 3 months.  If you need refills for medications you take chronically, please call your pharmacy. Do not use My Chart to request refills or for acute issues that need immediate attention. If you send a my chart message, it may take a few days to be addressed, specially if I am not in the office.  Please be sure medication list is accurate. If a new problem present, please set up appointment sooner than planned today. No changes today.

## 2023-11-10 NOTE — Assessment & Plan Note (Addendum)
 Has lost some wt since her last visit. She understands the benefits of wt loss as well as adverse effects of obesity. Consistency with healthy diet and physical activity encouraged.

## 2023-11-10 NOTE — Assessment & Plan Note (Signed)
Continue Rosuvastatin 10 mg daily and low-fat diet.

## 2023-11-10 NOTE — Progress Notes (Signed)
 HPI: Tammy Tucker Stable Tucker is a 63 y.o. female with a PMHx significant for HLD, prediabetes, aortic atherosclerosis, and anxiety, who is here today for chronic disease management.  Last seen on 03/24/2023  Mentions that since her last visit she has followed with Dr Alease Hunter for lower back pain radiated to LLE. She is on gabapentin  300 mg daily, which she took for a few days and now taking it as needed, medication is working very well.  She mentions she has gained some weight while on the gabapentin , but notes she has also stopped exercising.  Follow up with Dr. Alease Hunter scheduled for next month.   Hyperlipidemia: Currently on rosuvastatin  10 mg daily.  Tolerating medication well.  Lab Results  Component Value Date   CHOL 199 03/24/2023   HDL 64.90 03/24/2023   LDLCALC 108 (H) 03/24/2023   TRIG 129.0 03/24/2023   CHOLHDL 3 03/24/2023   Prediabetes:  Diet: She says she has been doing well with decreasing her intake of sweets until having a few social gatherings in the last couple weeks.  Exercise: She had been exercising regularly until stopping recently due to her back pain.  Lab Results  Component Value Date   HGBA1C 6.4 03/24/2023   Vitamin D  deficiency:  Currently taking 2000 units daily.  Lab Results  Component Value Date   VD25OH 20.37 (L) 03/24/2023   Anxiety:  Currently on hydroxyzine  25 mg 0.5-1 tablet twice daily as needed. She says she does not take it very often, and her anxiety has been well controlled lately.   Hypokalemia:  She is trying to get potassium from her diet.  Lab Results  Component Value Date   K 3.4 (L) 03/24/2023   Concerns today:   She had her colonoscopy done on 06/11/2023 and asks about the results today.   Review of Systems  Constitutional:  Negative for activity change, appetite change and fever.  Respiratory:  Negative for cough and shortness of breath.   Cardiovascular:  Negative for chest pain, palpitations and leg swelling.   Gastrointestinal:  Negative for abdominal pain and nausea.  Genitourinary:  Negative for decreased urine volume, dysuria and hematuria.  Musculoskeletal:  Positive for arthralgias and back pain.  Skin:  Negative for rash.  Neurological:  Negative for syncope and weakness.  See other pertinent positives and negatives in HPI.  Current Outpatient Medications on File Prior to Visit  Medication Sig Dispense Refill   AMBULATORY NON FORMULARY MEDICATION Cane Dispense 1 M25.562 Use as needed 1 Product 0   gabapentin  (NEURONTIN ) 300 MG capsule Take 1 capsule (300 mg total) by mouth 3 (three) times daily as needed. 90 capsule 3   hydrOXYzine  (ATARAX ) 25 MG tablet Take 0.5-1 tablets (12.5-25 mg total) by mouth 2 (two) times daily as needed for anxiety. 60 tablet 1   latanoprost  (XALATAN ) 0.005 % ophthalmic solution Place 1 drop into both eyes at bedtime.     LORazepam  (ATIVAN ) 0.5 MG tablet 1-2 tabs 30 - 60 min prior to MRI. Do not drive with this medicine. 4 tablet 0   Multiple Vitamin (MULTIVITAMIN) tablet Take 1 tablet by mouth daily.     raNITIdine HCl (ZANTAC PO) Take by mouth as needed (muscle relaxer).     rosuvastatin  (CRESTOR ) 10 MG tablet Take 1 tablet (10 mg total) by mouth daily. 90 tablet 3   tiZANidine  (ZANAFLEX ) 4 MG tablet Take 1 tablet (4 mg total) by mouth every 8 (eight) hours as needed. 30 tablet 1   No  current facility-administered medications on file prior to visit.   Past Medical History:  Diagnosis Date   Fibroids    Glaucoma    Obesity    Retinal pigmentation    Vitamin D  deficiency    Allergies  Allergen Reactions   Codeine Itching    Blisters around mouth   Tylenol  With Codeine #3 [Acetaminophen -Codeine] Itching   Social History   Socioeconomic History   Marital status: Married    Spouse name: Not on file   Number of children: 3   Years of education: Not on file   Highest education level: Not on file  Occupational History   Not on file  Tobacco Use    Smoking status: Never   Smokeless tobacco: Never  Vaping Use   Vaping status: Not on file  Substance and Sexual Activity   Alcohol use: Never   Drug use: Never   Sexual activity: Yes    Partners: Male  Other Topics Concern   Not on file  Social History Narrative   ** Merged History Encounter **       Social Drivers of Health   Financial Resource Strain: Not on file  Food Insecurity: Not on file  Transportation Needs: Not on file  Physical Activity: Not on file  Stress: Not on file  Social Connections: Not on file   Vitals:   11/10/23 0934  BP: 128/80  Pulse: 94  Resp: 16  SpO2: 96%   Wt Readings from Last 3 Encounters:  11/10/23 215 lb 2 oz (97.6 kg)  10/02/23 211 lb (95.7 kg)  07/02/23 213 lb (96.6 kg)   Body mass index is 43.45 kg/m.  Physical Exam Vitals and nursing note reviewed.  Constitutional:      General: She is not in acute distress.    Appearance: She is well-developed.  HENT:     Head: Normocephalic and atraumatic.     Mouth/Throat:     Mouth: Mucous membranes are moist.  Eyes:     Conjunctiva/sclera: Conjunctivae normal.  Cardiovascular:     Rate and Rhythm: Normal rate and regular rhythm.     Pulses:          Dorsalis pedis pulses are 2+ on the right side and 2+ on the left side.     Heart sounds: No murmur heard. Pulmonary:     Effort: Pulmonary effort is normal. No respiratory distress.     Breath sounds: Normal breath sounds.  Abdominal:     Palpations: Abdomen is soft. There is no mass.     Tenderness: There is no abdominal tenderness.  Musculoskeletal:     Right lower leg: No edema.     Left lower leg: No edema.  Skin:    General: Skin is warm.     Findings: No erythema or rash.  Neurological:     General: No focal deficit present.     Mental Status: She is alert and oriented to person, place, and time.     Comments: Antalgic gait, not assisted.  Psychiatric:        Mood and Affect: Mood and affect normal.   ASSESSMENT AND  PLAN:  Ms. Kipps was seen today for chronic disease management.   Orders Placed This Encounter  Procedures   Basic metabolic panel with GFR   VITAMIN D  25 Hydroxy (Vit-D Deficiency, Fractures)   Hemoglobin A1c   Lab Results  Component Value Date   HGBA1C 6.4 11/10/2023   Lab Results  Component Value Date  VD25OH 44.54 11/10/2023   Lab Results  Component Value Date   NA 142 11/10/2023   CL 106 11/10/2023   K 3.7 11/10/2023   CO2 27 11/10/2023   BUN 9 11/10/2023   CREATININE 0.69 11/10/2023   GFR 92.61 11/10/2023   CALCIUM  9.8 11/10/2023   ALBUMIN 4.4 03/24/2023   GLUCOSE 100 (H) 11/10/2023   Prediabetes -     Hemoglobin A1c; Future  Anxiety disorder, unspecified type Assessment & Plan: She is reporting improvement, has not needed Hydroxyzine  for months.   Vitamin D  deficiency, unspecified Assessment & Plan: Continue vit D 2000 U daily. Further recommendations according to 25 OH vit D result.  Orders: -     VITAMIN D  25 Hydroxy (Vit-D Deficiency, Fractures); Future  Hypokalemia Assessment & Plan: Mild, K+ 3.4. Potassium rich diet recommended for now. Further recommendations according to BMP results.  Orders: -     Basic metabolic panel with GFR; Future  Morbid obesity (HCC) Assessment & Plan: Has lost some wt since her last visit. She understands the benefits of wt loss as well as adverse effects of obesity. Consistency with healthy diet and physical activity encouraged.   Hyperlipidemia, unspecified hyperlipidemia type Assessment & Plan: Continue Rosuvastatin  10  mg daily and low fat diet.  Orders: -     Rosuvastatin  Calcium ; Take 1 tablet (10 mg total) by mouth daily.  Dispense: 90 tablet; Refill: 3  Discussed colonoscopy report.  Return in about 3 months (around 02/10/2024) for welcome to medicare.  I, Fritz Jewel Wierda, acting as a scribe for Sharni Negron [place], MD., have documented all relevant documentation on the behalf of Noorah Giammona [place], MD, as  directed by  Shade Kaley [place], MD while in the presence of Maysie Parkhill [place], MD.   I, Sierra Bissonette [place], MD, have reviewed all documentation for this visit. The documentation on 11/10/23 for the exam, diagnosis, procedures, and orders are all accurate and complete.  Seanmichael Salmons G. [place], MD  [facility]. [place] office.

## 2023-11-10 NOTE — Assessment & Plan Note (Signed)
Continue vit D 2000 U daily.  Further recommendations according to 25 OH vit D result.

## 2023-11-17 ENCOUNTER — Telehealth: Payer: Self-pay | Admitting: Family Medicine

## 2023-11-17 NOTE — Telephone Encounter (Signed)
Left a VM for patient to call back

## 2023-11-17 NOTE — Telephone Encounter (Signed)
 Patient called back. Aware of message below. She will drop them off this week.

## 2023-11-17 NOTE — Telephone Encounter (Signed)
 Patient is calling because she has paper work that is needing to be filled out for disability. She is wondering if she needs an appt for it to be filled out or does she drop it off and have him fill it out. She is asking if there is a fee to get the forms filled out. They are giving her a couple weeks to get it back. Please advise.

## 2023-11-17 NOTE — Telephone Encounter (Signed)
 She can drop them by the office and I will work on them. I will reach out to her if I have any questions while working on the form. There is NOT a form completion fee.

## 2023-12-02 NOTE — Telephone Encounter (Signed)
 Still have not received forms. Called pt and left VM to call the office.

## 2023-12-08 NOTE — Telephone Encounter (Signed)
 Will hold forms until visit.

## 2023-12-08 NOTE — Telephone Encounter (Signed)
 Called pt, left VM to call the office.   Spoke with Dr. Alease Hunter, pt needs OV before forms can be completed accurately. Please schedule patient if she calls back or stops by to pick up forms.

## 2023-12-09 ENCOUNTER — Ambulatory Visit (INDEPENDENT_AMBULATORY_CARE_PROVIDER_SITE_OTHER): Admitting: Family Medicine

## 2023-12-09 VITALS — BP 162/108 | HR 105 | Ht 59.0 in | Wt 212.0 lb

## 2023-12-09 DIAGNOSIS — G8929 Other chronic pain: Secondary | ICD-10-CM | POA: Diagnosis not present

## 2023-12-09 DIAGNOSIS — M25552 Pain in left hip: Secondary | ICD-10-CM

## 2023-12-09 DIAGNOSIS — M5442 Lumbago with sciatica, left side: Secondary | ICD-10-CM

## 2023-12-09 NOTE — Patient Instructions (Addendum)
 Thank you for coming in today.   Plan to get the MRI  Let me know if that does not happen like it should.   Continue gabapentin  as needed.   I think we will be doing a back injection once we get the MRI back.

## 2023-12-09 NOTE — Telephone Encounter (Signed)
 Form completed, reviewed and signed by Dr. Alease Hunter, and placed at the front desk for faxing/scanning (has been faxed Kermit Ped with [employer]).

## 2023-12-09 NOTE — Progress Notes (Signed)
   Joanna Muck, PhD, LAT, ATC acting as a scribe for Garlan Juniper, MD.  Annabell Baron Tammy Tucker is a 63 y.o. female who presents to [facility] today for f/u chronic low back, L hip pain, and to update FMLA paperwork. Pt was last seen by Dr. Alease Hunter on 10/02/23 and was l-spine MRI was ordered. Gabapentin  and tizanidine  prescribed.   Today, pt reports low back pain is about the same. She notes the Gabapentin  seems to be helpful, but it does make her loopy and dizzy. She dropped off her form a couple days ago and there is urgency to get the forms completed/submitted.   She notes continued back pain and bilateral left worse than right leg pain radiating from her buttocks to the lateral thighs to the lateral calves.  The pain is quite severe at times interfere with her ability to walk more than about 100 yards without having to stop to rest.  She no longer is able to shop independently due to this pain.  Additionally she notes prolonged sitting more than about 30 minutes is quite painful requiring shifting of position.  Prolonged standing is also quite painful more than about 30 minutes.  Dx imaging: L-spine MRI--- order pending 09/05/22 L hip & L-spine XR   Pertinent review of systems: No fevers or chills  Relevant historical information: Obesity.  Vitamin D  deficiency.   Exam:  BP (!) 162/108   Pulse (!) 105   Ht 4' 11 (1.499 m)   Wt 212 lb (96.2 kg)   LMP 06/01/2013   SpO2 96%   BMI 42.82 kg/m  General: Well Developed, well nourished, and in no acute distress.   MSK: L-spine decreased lumbar motion.  Lower extremity strength is intact.  Antalgic gait. Patient appears to be uncomfortable after sitting for about 30 minutes requiring position shifting.      Assessment and Plan: 64 y.o. 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  Left is worse than right.  Patient has a MRI order that is in process of  being done.  This had to be delayed because of insurance change.  This should be able to get scheduled in the near future.  Once we get the MRI results back anticipate potentially epidural steroid injection or facet injection.  Additionally patient is not able to work.  She remains permanently disabled due to her back and leg pain.  Additionally she has some difficulty with vision it was previously identified on prior disability paperwork.   PDMP not reviewed this encounter. No orders of the defined types were placed in this encounter.  No orders of the defined types were placed in this encounter.    Discussed warning signs or symptoms. Please see discharge instructions. Patient expresses understanding.   The above documentation has been reviewed and is accurate and complete Garlan Juniper, M.D.

## 2023-12-09 NOTE — Telephone Encounter (Signed)
 Forms and OV notes for 12/09/2023 DOS faxed successfully to [phone]. Forms sent to scan.

## 2024-01-25 ENCOUNTER — Other Ambulatory Visit: Payer: Self-pay | Admitting: Family Medicine

## 2024-01-25 DIAGNOSIS — F419 Anxiety disorder, unspecified: Secondary | ICD-10-CM

## 2024-03-24 ENCOUNTER — Telehealth: Payer: Self-pay | Admitting: *Deleted

## 2024-03-24 NOTE — Telephone Encounter (Signed)
 Copied from CRM #[id number]. Topic: Clinical - Request for Lab/Test Order >> Mar 24, 2024 10:40 AM Tammy Tucker wrote: Reason for CRM: Patient is asking if her fasting blood work would be done at her [facility] appointment, patient  states she is due for it.

## 2024-03-25 NOTE — Telephone Encounter (Signed)
 Will determine which labs are needed at the time of visit depending of PMHx. Thanks, BJ

## 2024-03-28 ENCOUNTER — Telehealth: Payer: Self-pay

## 2024-03-28 DIAGNOSIS — H0102A Squamous blepharitis right eye, upper and lower eyelids: Secondary | ICD-10-CM | POA: Diagnosis not present

## 2024-03-28 DIAGNOSIS — H2513 Age-related nuclear cataract, bilateral: Secondary | ICD-10-CM | POA: Diagnosis not present

## 2024-03-28 DIAGNOSIS — H0102B Squamous blepharitis left eye, upper and lower eyelids: Secondary | ICD-10-CM | POA: Diagnosis not present

## 2024-03-28 DIAGNOSIS — H17822 Peripheral opacity of cornea, left eye: Secondary | ICD-10-CM | POA: Diagnosis not present

## 2024-03-28 DIAGNOSIS — H40053 Ocular hypertension, bilateral: Secondary | ICD-10-CM | POA: Diagnosis not present

## 2024-03-28 DIAGNOSIS — H3552 Pigmentary retinal dystrophy: Secondary | ICD-10-CM | POA: Diagnosis not present

## 2024-03-28 NOTE — Telephone Encounter (Signed)
 Received CRM that patient returned call. Attempted to reach patient. No answer. Left voicemail for patient to return my call.

## 2024-03-28 NOTE — Telephone Encounter (Signed)
 Spoke with patient regarding Dr. Gib response. Patient questioned the Welcome to Medicare and Annual Physical at the same appointment. Advised patient the Welcome to Medicare is required per her insurance. Advised patient to fast for any lab work that may be needed. Patient voiced understanding.

## 2024-03-28 NOTE — Telephone Encounter (Signed)
 Left voicemail for patient to return my call.

## 2024-03-28 NOTE — Telephone Encounter (Signed)
 Noted in telephone note 03/24/2024.     Copied from CRM #[id number]. Topic: General - Other >> Mar 28, 2024 12:45 PM Mercedes MATSU wrote: Reason for CRM: Patient called in requesting a call back from [facility] [phone].

## 2024-04-06 ENCOUNTER — Ambulatory Visit: Payer: Self-pay | Admitting: Family Medicine

## 2024-04-06 ENCOUNTER — Ambulatory Visit: Admitting: Family Medicine

## 2024-04-06 ENCOUNTER — Encounter: Payer: Self-pay | Admitting: Family Medicine

## 2024-04-06 VITALS — BP 136/80 | HR 87 | Temp 97.9°F | Ht 59.0 in | Wt 221.0 lb

## 2024-04-06 DIAGNOSIS — Z0000 Encounter for general adult medical examination without abnormal findings: Secondary | ICD-10-CM | POA: Diagnosis not present

## 2024-04-06 DIAGNOSIS — Z23 Encounter for immunization: Secondary | ICD-10-CM

## 2024-04-06 DIAGNOSIS — E785 Hyperlipidemia, unspecified: Secondary | ICD-10-CM

## 2024-04-06 DIAGNOSIS — R350 Frequency of micturition: Secondary | ICD-10-CM

## 2024-04-06 DIAGNOSIS — R7303 Prediabetes: Secondary | ICD-10-CM

## 2024-04-06 DIAGNOSIS — R002 Palpitations: Secondary | ICD-10-CM

## 2024-04-06 DIAGNOSIS — E559 Vitamin D deficiency, unspecified: Secondary | ICD-10-CM | POA: Diagnosis not present

## 2024-04-06 LAB — TSH: TSH: 1.54 u[IU]/mL (ref 0.35–5.50)

## 2024-04-06 LAB — COMPREHENSIVE METABOLIC PANEL WITH GFR
ALT: 20 U/L (ref 0–35)
AST: 21 U/L (ref 0–37)
Albumin: 4.7 g/dL (ref 3.5–5.2)
Alkaline Phosphatase: 84 U/L (ref 39–117)
BUN: 8 mg/dL (ref 6–23)
CO2: 29 meq/L (ref 19–32)
Calcium: 9.7 mg/dL (ref 8.4–10.5)
Chloride: 104 meq/L (ref 96–112)
Creatinine, Ser: 0.63 mg/dL (ref 0.40–1.20)
GFR: 94.4 mL/min (ref >60.00)
Glucose, Bld: 101 mg/dL — ABNORMAL HIGH (ref 70–99)
Potassium: 3.5 meq/L (ref 3.5–5.1)
Sodium: 141 meq/L (ref 135–145)
Total Bilirubin: 0.5 mg/dL (ref 0.2–1.2)
Total Protein: 7.9 g/dL (ref 6.0–8.3)

## 2024-04-06 LAB — CBC
HCT: 39.6 % (ref 36.0–46.0)
Hemoglobin: 13.2 g/dL (ref 12.0–15.0)
MCHC: 33.2 g/dL (ref 30.0–36.0)
MCV: 88.4 fl (ref 78.0–100.0)
Platelets: 279 K/uL (ref 150.0–400.0)
RBC: 4.48 Mil/uL (ref 3.87–5.11)
RDW: 14.3 % (ref 11.5–15.5)
WBC: 4.7 K/uL (ref 4.0–10.5)

## 2024-04-06 LAB — VITAMIN D 25 HYDROXY (VIT D DEFICIENCY, FRACTURES): VITD: 31.46 ng/mL (ref 30.00–100.00)

## 2024-04-06 LAB — LIPID PANEL
Cholesterol: 201 mg/dL — ABNORMAL HIGH (ref 0–200)
HDL: 65.7 mg/dL (ref >39.00)
LDL Cholesterol: 116 mg/dL — ABNORMAL HIGH (ref 0–99)
NonHDL: 135.44
Total CHOL/HDL Ratio: 3
Triglycerides: 99 mg/dL (ref 0.0–149.0)
VLDL: 19.8 mg/dL (ref 0.0–40.0)

## 2024-04-06 LAB — HEMOGLOBIN A1C: Hgb A1c MFr Bld: 6.4 % (ref 4.6–6.5)

## 2024-04-06 NOTE — Patient Instructions (Addendum)
  Ms. Dimaano , Thank you for taking time to come for your Medicare Wellness Visit. I appreciate your ongoing commitment to your health goals. Please review the following plan we discussed and let me know if I can assist you in the future.   These are the goals we discussed:  Goals       Acknowledge receipt of Advanced Directive package     DIET - REDUCE CALORIE INTAKE     Exercise 3x per week (30 min per time)     Low impact, 3-4 times per week.        This is a list of the screening recommended for you and due dates:  Health Maintenance  Topic Date Due   Medicare Annual Wellness Visit  Never done   Yearly kidney health urinalysis for diabetes  Never done   Pneumococcal Vaccine for age over 35 (1 of 2 - PCV) Never done   Zoster (Shingles) Vaccine (1 of 2) Never done   Flu Shot  Never done   COVID-19 Vaccine (1 - 2024-25 season) Never done   Eye exam for diabetics  03/16/2024   Hemoglobin A1C  05/12/2024   Yearly kidney function blood test for diabetes  11/09/2024   Breast Cancer Screening  03/30/2025   Pap with HPV screening  07/01/2026   Colon Cancer Screening  06/10/2030   DTaP/Tdap/Td vaccine (2 - Td or Tdap) 09/08/2030   Hepatitis C Screening  Completed   HIV Screening  Completed   Hepatitis B Vaccine  Aged Out   HPV Vaccine  Aged Out   Meningitis B Vaccine  Aged Out   Complete foot exam   Discontinued   A few things to remember from today's visit:  Hyperlipidemia, unspecified hyperlipidemia type - Plan: Lipid panel, Comprehensive metabolic panel with GFR  Prediabetes - Plan: Hemoglobin A1c  Palpitations - Plan: EKG 12-Lead, Ambulatory referral to Cardiology  Urinary frequency - Plan: Urinalysis with Culture Reflex  Vitamin D  deficiency, unspecified - Plan: VITAMIN D  25 Hydroxy (Vit-D Deficiency, Fractures)  Welcome to Medicare preventive visit - Plan: EKG 12-Lead  Morbid obesity (HCC) - Plan: Amb Ref to Medical Weight Management   If you need refills for  medications you take chronically, please call your pharmacy. Do not use My Chart to request refills or for acute issues that need immediate attention. If you send a my chart message, it may take a few days to be addressed, specially if I am not in the office.  Please be sure medication list is accurate. If a new problem present, please set up appointment sooner than planned today.

## 2024-04-06 NOTE — Assessment & Plan Note (Signed)
Further recommendations according to 25 OH vit D result. 

## 2024-04-06 NOTE — Assessment & Plan Note (Signed)
Continue Rosuvastatin 10 mg daily. Low fat diet also recommended.

## 2024-04-06 NOTE — Assessment & Plan Note (Signed)
 Last HgA1C 6.4 in 10/2023. Encouraged consistency with a healthy life style for diabetes prevention. Further recommendations according to HgA1C result.

## 2024-04-06 NOTE — Assessment & Plan Note (Signed)
 She has gained some wt since 11/2023. Consistency with healthy diet and physical activity encouraged. She would like a referral to Healthy wt and wellness clinic.
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
## 2024-04-07 ENCOUNTER — Encounter (INDEPENDENT_AMBULATORY_CARE_PROVIDER_SITE_OTHER): Payer: Self-pay

## 2024-04-07 LAB — URINALYSIS W MICROSCOPIC + REFLEX CULTURE
Bacteria, UA: NONE SEEN /HPF
Bilirubin Urine: NEGATIVE
Glucose, UA: NEGATIVE
Hgb urine dipstick: NEGATIVE
Hyaline Cast: NONE SEEN /LPF
Ketones, ur: NEGATIVE
Leukocyte Esterase: NEGATIVE
Nitrites, Initial: NEGATIVE
Protein, ur: NEGATIVE
RBC / HPF: NONE SEEN /HPF (ref 0–2)
Specific Gravity, Urine: 1.002 (ref 1.001–1.035)
Squamous Epithelial / HPF: NONE SEEN /HPF (ref <=5)
WBC, UA: NONE SEEN /HPF (ref 0–5)
pH: 6.5 (ref 5.0–8.0)

## 2024-04-07 LAB — NO CULTURE INDICATED

## 2024-04-18 ENCOUNTER — Institutional Professional Consult (permissible substitution) (INDEPENDENT_AMBULATORY_CARE_PROVIDER_SITE_OTHER): Admitting: Family Medicine

## 2024-04-18 ENCOUNTER — Encounter (INDEPENDENT_AMBULATORY_CARE_PROVIDER_SITE_OTHER): Payer: Self-pay

## 2024-04-20 ENCOUNTER — Other Ambulatory Visit: Payer: Self-pay

## 2024-04-20 DIAGNOSIS — F419 Anxiety disorder, unspecified: Secondary | ICD-10-CM

## 2024-04-20 MED ORDER — HYDROXYZINE HCL 25 MG PO TABS: 12.5000 mg | ORAL_TABLET | Freq: Two times a day (BID) | ORAL | 1 refills | Status: DC | PRN

## 2024-04-28 ENCOUNTER — Encounter (INDEPENDENT_AMBULATORY_CARE_PROVIDER_SITE_OTHER): Payer: Self-pay

## 2024-04-28 ENCOUNTER — Institutional Professional Consult (permissible substitution) (INDEPENDENT_AMBULATORY_CARE_PROVIDER_SITE_OTHER): Admitting: Family Medicine

## 2024-05-04 ENCOUNTER — Other Ambulatory Visit: Payer: Self-pay | Admitting: Family Medicine

## 2024-05-04 DIAGNOSIS — Z1231 Encounter for screening mammogram for malignant neoplasm of breast: Secondary | ICD-10-CM
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
## 2024-05-18 ENCOUNTER — Institutional Professional Consult (permissible substitution) (INDEPENDENT_AMBULATORY_CARE_PROVIDER_SITE_OTHER): Admitting: Nurse Practitioner

## 2024-05-19 ENCOUNTER — Encounter: Payer: Self-pay | Admitting: Cardiology

## 2024-05-19 ENCOUNTER — Ambulatory Visit: Attending: Cardiology | Admitting: Cardiology

## 2024-05-19 ENCOUNTER — Ambulatory Visit

## 2024-05-19 VITALS — BP 138/88 | HR 85 | Ht 59.0 in | Wt 219.8 lb

## 2024-05-19 DIAGNOSIS — R002 Palpitations: Secondary | ICD-10-CM

## 2024-05-19 NOTE — Patient Instructions (Signed)
 Medication Instructions:  Continue same medications  Lab Work: None ordered  Testing/Procedures: 2 week Heart Monitor   Follow-Up: At [facility], you and your health needs are our priority.  As part of our continuing mission to provide you with exceptional heart care, our providers are all part of one team.  This team includes your primary Cardiologist (physician) and Advanced Practice Providers or APPs (Physician Assistants and Nurse Practitioners) who all work together to provide you with the care you need, when you need it.  Your next appointment:  To Be Determined    Provider:  Dr.Jordan   We recommend signing up for the patient portal called MyChart.  Sign up information is provided on this After Visit Summary.  MyChart is used to connect with patients for Virtual Visits (Telemedicine).  Patients are able to view lab/test results, encounter notes, upcoming appointments, etc.  Non-urgent messages can be sent to your provider as well.   To learn more about what you can do with MyChart, go to [email or web address].

## 2024-05-19 NOTE — Progress Notes (Unsigned)
 Enrolled patient for a 14 day Zio XT  monitor to be mailed to patients home

## 2024-05-24 ENCOUNTER — Ambulatory Visit

## 2024-05-24 DIAGNOSIS — Z1231 Encounter for screening mammogram for malignant neoplasm of breast: Secondary | ICD-10-CM

## 2024-06-02 ENCOUNTER — Encounter (INDEPENDENT_AMBULATORY_CARE_PROVIDER_SITE_OTHER): Payer: Self-pay | Admitting: Family Medicine

## 2024-06-02 ENCOUNTER — Ambulatory Visit (INDEPENDENT_AMBULATORY_CARE_PROVIDER_SITE_OTHER): Admitting: Family Medicine

## 2024-06-02 VITALS — BP 151/84 | HR 97 | Temp 98.5°F | Ht 59.5 in | Wt 216.0 lb

## 2024-06-02 DIAGNOSIS — R030 Elevated blood-pressure reading, without diagnosis of hypertension: Secondary | ICD-10-CM | POA: Insufficient documentation

## 2024-06-02 DIAGNOSIS — R7303 Prediabetes: Secondary | ICD-10-CM

## 2024-06-02 DIAGNOSIS — E785 Hyperlipidemia, unspecified: Secondary | ICD-10-CM

## 2024-06-02 DIAGNOSIS — Z6841 Body Mass Index (BMI) 40.0 and over, adult: Secondary | ICD-10-CM

## 2024-06-02 NOTE — Progress Notes (Signed)
 Tammy DOROTHA Jenkins, DO, ABFM, ABOM Bariatric physician [id number] [place], [place], [place] [address] Office: [phone]  /  Fax: [phone]     Initial Evaluation:  Tammy Tucker was seen in clinic today to evaluate for obesity. Tammy Tucker is interested in losing weight to improve overall health and reduce the risk of weight related complications. Tammy Tucker presents today to review program treatment options, initial physical assessment, and evaluation.      Tammy Tucker was referred by: PCP  When asked what they hope to accomplish? Tammy Tucker states: improve existing medical conditions, improve quality of life and prevent disease.   When asked how has your weight affected you? Tammy Tucker states: Has affected self-esteem, Contributed to medical problems, Contributed to orthopedic problems or mobility issues, Having fatigue, and Having poor endurance  Contributing factors to her weight change: family history of obesity, consumption of processed foods, and reduced physical activity  Some associated conditions: Hyperlipidemia and Prediabetes  Current nutrition plan: None- but trying to eat more veggies and less bread.   Current level of physical activity: None  Current or previous pharmacotherapy: None  Response to medication: Never tried medications   Barriers to weight loss that patient expresses a concern about today: having difficulty focusing on healthy eating and difficulty implementing reduced calorie nutrition plan.     Past Medical History:  Diagnosis Date   Fibroids    Glaucoma    Obesity    Retinal pigmentation    Vitamin D  deficiency     Current Outpatient Medications  Medication Instructions   acetaminophen  (TYLENOL ) 500 mg, Every 6 hours PRN   AMBULATORY NON FORMULARY MEDICATION Cane Dispense 1 M25.562 Use as needed   Biotin 5000 MCG CAPS Take by mouth.   Calcium -Vitamin D -Vitamin K (VIACTIV CALCIUM  PLUS D PO) Take by mouth.   hydrOXYzine  (ATARAX ) 25 mg, 2 times daily   latanoprost   (XALATAN ) 0.005 % ophthalmic solution 1 drop, Daily at bedtime   Multiple Vitamin (MULTIVITAMIN) tablet 1 tablet, Daily   raNITIdine HCl (ZANTAC PO) As needed   rosuvastatin  (CRESTOR ) 10 mg, Oral, Daily     Allergies  Allergen Reactions   Codeine Itching    Blisters around mouth   Tylenol  With Codeine #3 [Acetaminophen -Codeine] Itching     Past Surgical History:  Procedure Laterality Date   BREAST BIOPSY  1977   benign   CESAREAN SECTION  1994, 1990   ORIF CLAVICULAR FRACTURE Right 09/08/2020   Procedure: OPEN REDUCTION INTERNAL FIXATION (ORIF) CLAVICULAR FRACTURE;  Surgeon: Cristy Bonner DASEN, MD;  Location: [facility] OR;  Service: Orthopedics;  Laterality: Right;   PERCUTANEOUS PINNING Right 09/09/2020   Procedure: PERCUTANEOUS PINNING METACARPAL;  Surgeon: Josefina Chew, MD;  Location: [facility] OR;  Service: Orthopedics;  Laterality: Right;     Family History  Problem Relation Age of Onset   Colon polyps Mother    Diabetes Mother    Hearing loss Mother    Hypertension Mother    Alcohol abuse Father    Depression Father    Early death Father    Diabetes Sister    Asthma Sister    Asthma Sister    Depression Sister    Asthma Sister    Depression Sister    Hypertension Sister    Bipolar disorder Son    Rectal cancer Neg Hx    Stomach cancer Neg Hx    Colon cancer Neg Hx    Esophageal cancer Neg Hx      Objective:  BP (!) 151/84  Pulse 97   Temp 98.5 F (36.9 C)   Ht 4' 11.5 (1.511 m)   Wt 216 lb (98 kg)   LMP 06/01/2013   SpO2 95%   BMI 42.90 kg/m  Tammy Tucker was weighed on the bioimpedance scale: Body mass index is 42.9 kg/m.  Visceral Fat rating : 17, Body Fat %:49.7  Vitals Temp: 98.5 F (36.9 C) BP: (!) 151/84 Pulse Rate: 97 SpO2: 95 %   Anthropometric Measurements Height: 4' 11.5 (1.511 m) Weight: 216 lb (98 kg) BMI (Calculated): 42.91 Peak Weight: 216lb   Body Composition  Body Fat %: 49.7 % Fat Mass (lbs): 107.6 lbs Muscle Mass (lbs): 103.2 lbs Total  Body Water (lbs): 72.8 lbs Visceral Fat Rating : 17   Other Clinical Data Fasting: No Labs: no Today's Visit #: info session     General: Well Developed, well nourished, and in no acute distress.  HEENT: Normocephalic, atraumatic; EOMI, sclerae are anicteric. Skin: Warm and dry, good turgor Chest:  Normal excursion, shape, no gross ABN Respiratory: No conversational dyspnea; speaking in full sentences NeuroM-Sk:  Normal gross ROM * 4 extremities  Psych: A and O *3, insight adequate, mood- full    Assessment and Plan:   FOR THE DISEASE OF OBESITY:  BMI 40.0-44.9, [facility]) Assessment & Plan: We reviewed anthropometrics, biometrics, associated medical conditions and contributing factors with patient. Tammy Tucker would benefit from a medically tailored reduced calorie nutrional plan based on their REE (resting energy expenditure), which will be determined by indirect calorimetry.  We will also assess for cardiometabolic risk and nutritional derangements via fasting labs at intake appointment.    Obesity Treatment / Action Plan:   Tammy Tucker was weighed on the bioimpedance scale and results were discussed and documented in the synopsis.   Tammy Tucker will complete provided nutritional and psychosocial assessment questionnaire before the next appointment.  Tammy Tucker will be scheduled for indirect calorimetry to determine resting energy expenditure in a fasting state.  This will allow us  to create a reduced calorie, high-protein meal plan to promote loss of fat mass while preserving muscle mass.  We will also assess for cardiometabolic risk and nutritional derangements via an ECG and fasting serologies at her next appointment.  Tammy Tucker was encouraged to work on amassing support from family and friends to begin their weight loss journey.   Work on eliminating or reducing the presence of highly processed, poorly nutritious, calorie-dense foods in the home.   Obesity Education Performed  Today:  Patient was counseled on nutritional approaches to weight loss and benefits of reducing processed foods and consuming plant-based foods and high quality protein as part of nutritional weight management program.   We discussed the importance of long term lifestyle changes which include nutrition, exercise and behavioral modifications as well as the importance of customizing this to her specific health and social needs.   We discussed the benefits of reaching a healthier weight to alleviate the symptoms of existing conditions and reduce the risks of the biomechanical, metabolic and psychological effects of obesity.  Was counseled on the health benefits of losing 5%-10% of total body weight.  Was counseled on our cognitive behavorial therapy program, lead by our bariatric psychologist, who focuses on emotional eating and creating positive behavorial change.  Was counseled on bariatric pharmacotherapy and how this may be used as an adjunct in their weight management    Tammy Tucker appears to be in the action stage of change and states they are ready to start intensive  lifestyle modifications and behavioral modifications.  It was recommended that Tammy Tucker follow up in the next 1-2 weeks to review the above steps, and to continue with treatment of their chronic disease state of obesity   FOR OTHER CONDITIONS RELATED TO THE DISEASE OF OBESITY:  Hyperlipidemia, unspecified hyperlipidemia type Assessment & Plan Lab Results  Component Value Date   CHOL 201 (H) 04/06/2024   HDL 65.70 04/06/2024   LDLCALC 116 (H) 04/06/2024   TRIG 99.0 04/06/2024   CHOLHDL 3 04/06/2024   Patient states that Tammy Tucker was diagnosed with HLD for about 3 years since Tammy Tucker has seen her doctor. Tammy Tucker is currently taking Crestor  10 mg daily. Patient reports that Tammy Tucker was previously on 20 mg Crestor  but was lowered recently. Tammy Tucker would benefit from a program like this one with a meal plan that is low in saturated and trans fat. Tammy Tucker  would also benefit in a weight loss program to help continue lower cholesterol levels.     Prediabetes Assessment & Plan Lab Results  Component Value Date   HGBA1C 6.4 04/06/2024   HGBA1C 6.4 11/10/2023   HGBA1C 6.4 03/24/2023    Patient states that Tammy Tucker was diagnosed with PreDM for about 3 years now. Tammy Tucker states that Tammy Tucker was diagnosed by her current doctor. Tammy Tucker states that Tammy Tucker has never been on any medication to help with her PreDM. Tammy Tucker endorses that her A1C has been stable throughout the years. Tammy Tucker reports that Tammy Tucker has been trying to decrease her A1C but has struggled. Patient would benefit from a program like this that decreases simple carbs and sugars.     Elevated blood pressure reading Assessment & Plan BP Readings from Last 3 Encounters:  06/02/24 (!) 151/84  05/19/24 138/88  04/06/24 136/80   The 10-year ASCVD risk score (Arnett DK, et al., 2019) is: 21.4%  Lab Results  Component Value Date   CREATININE 0.63 04/06/2024   Patient states that Tammy Tucker has never been diagnosed with hypertension. Tammy Tucker reports that Tammy Tucker has family that has HTN. BP today is elevated. Patient states that this is not what her BP usually runs. Patient denies any lightheadedness or dizziness.     Attestations:   Tammy Tucker, acting as a medical scribe for Tammy Jenkins, DO., have compiled all relevant documentation for today's office visit on behalf of Tammy Jenkins, DO, while in the presence of [employer], DO.  I have spent 45 minutes in the care of the patient today.  35 minutes was spent in face to face counseling of the patient on the disease of obesity and what our program can do for their medical conditions as well as in preventing future diseases. I discussed the importance of comprehensive care in the treatment of obesity including mental well being and physical activity. 10 minutes was spent on pre-chart review and additional post visit documentation.   I have reviewed the above  documentation for accuracy and completeness, and I agree with the above. Tammy Tucker, D.O.  The 21st Century Cures Act was signed into law in 2016 which includes the topic of electronic health records.  This provides immediate access to information in MyChart.  This includes consultation notes, operative notes, office notes, lab results and pathology reports.  If you have any questions about what you read please let us  know at your next visit so we can discuss your concerns and take corrective action if need be.  We are right here with you!

## 2024-06-14 ENCOUNTER — Ambulatory Visit: Payer: Self-pay | Admitting: Cardiology

## 2024-06-14 DIAGNOSIS — R002 Palpitations: Secondary | ICD-10-CM

## 2024-06-17 ENCOUNTER — Other Ambulatory Visit: Payer: Self-pay

## 2024-06-17 ENCOUNTER — Ambulatory Visit

## 2024-06-17 DIAGNOSIS — F419 Anxiety disorder, unspecified: Secondary | ICD-10-CM

## 2024-06-17 MED ORDER — HYDROXYZINE HCL 25 MG PO TABS: 25.0000 mg | ORAL_TABLET | Freq: Every evening | ORAL | 0 refills | Status: AC | PRN

## 2024-06-17 NOTE — Telephone Encounter (Signed)
 [facility] is requesting a 90 DS of Hydroxyzine  25 mg . Please Advise ?

## 2024-07-08 ENCOUNTER — Ambulatory Visit

## 2024-07-20 ENCOUNTER — Ambulatory Visit
Admission: RE | Admit: 2024-07-20 | Discharge: 2024-07-20 | Disposition: A | Source: Ambulatory Visit | Attending: Family Medicine | Admitting: Family Medicine

## 2024-07-20 DIAGNOSIS — Z1231 Encounter for screening mammogram for malignant neoplasm of breast: Secondary | ICD-10-CM
